# Patient Record
Sex: Female | Born: 1970 | Race: White | Hispanic: No
Health system: Midwestern US, Community
[De-identification: ages and names within clinical notes are randomized; demographics above are authoritative.]

## PROBLEM LIST (undated history)

## (undated) DIAGNOSIS — C519 Malignant neoplasm of vulva, unspecified: Secondary | ICD-10-CM

## (undated) DIAGNOSIS — J449 Chronic obstructive pulmonary disease, unspecified: Secondary | ICD-10-CM

## (undated) DIAGNOSIS — B009 Herpesviral infection, unspecified: Secondary | ICD-10-CM

## (undated) DIAGNOSIS — I10 Essential (primary) hypertension: Secondary | ICD-10-CM

## (undated) HISTORY — PX: TUBAL LIGATION: SHX77

## (undated) HISTORY — DX: Herpesviral infection, unspecified: B00.9

## (undated) HISTORY — PX: OTHER SURGICAL HISTORY: SHX169

---

## 1999-06-01 ENCOUNTER — Emergency Department (HOSPITAL_COMMUNITY): Admission: EM | Admit: 1999-06-01 | Discharge: 1999-06-01 | Payer: Self-pay | Admitting: Emergency Medicine

## 2000-02-28 ENCOUNTER — Emergency Department (HOSPITAL_COMMUNITY): Admission: EM | Admit: 2000-02-28 | Discharge: 2000-02-28 | Payer: Self-pay | Admitting: Emergency Medicine

## 2000-06-06 ENCOUNTER — Encounter: Admission: RE | Admit: 2000-06-06 | Discharge: 2000-06-06 | Payer: Self-pay | Admitting: *Deleted

## 2000-06-06 ENCOUNTER — Encounter: Payer: Self-pay | Admitting: *Deleted

## 2000-08-17 ENCOUNTER — Emergency Department (HOSPITAL_COMMUNITY): Admission: EM | Admit: 2000-08-17 | Discharge: 2000-08-17 | Payer: Self-pay | Admitting: Emergency Medicine

## 2000-11-04 ENCOUNTER — Emergency Department (HOSPITAL_COMMUNITY): Admission: EM | Admit: 2000-11-04 | Discharge: 2000-11-05 | Payer: Self-pay | Admitting: Emergency Medicine

## 2000-11-05 ENCOUNTER — Encounter: Payer: Self-pay | Admitting: Emergency Medicine

## 2000-11-06 ENCOUNTER — Emergency Department (HOSPITAL_COMMUNITY): Admission: EM | Admit: 2000-11-06 | Discharge: 2000-11-07 | Payer: Self-pay | Admitting: Emergency Medicine

## 2003-02-09 ENCOUNTER — Emergency Department (HOSPITAL_COMMUNITY): Admission: EM | Admit: 2003-02-09 | Discharge: 2003-02-09 | Payer: Self-pay | Admitting: Emergency Medicine

## 2013-01-30 ENCOUNTER — Encounter (HOSPITAL_COMMUNITY): Payer: Self-pay | Admitting: *Deleted

## 2013-01-30 ENCOUNTER — Emergency Department (HOSPITAL_COMMUNITY)
Admission: EM | Admit: 2013-01-30 | Discharge: 2013-01-30 | Disposition: A | Discharge status: Home / Self Care | Payer: Medicaid Other | Attending: Emergency Medicine | Admitting: Emergency Medicine

## 2013-01-30 ENCOUNTER — Emergency Department (HOSPITAL_COMMUNITY): Payer: Medicaid Other

## 2013-01-30 DIAGNOSIS — Z79899 Other long term (current) drug therapy: Secondary | ICD-10-CM | POA: Insufficient documentation

## 2013-01-30 DIAGNOSIS — I10 Essential (primary) hypertension: Secondary | ICD-10-CM | POA: Insufficient documentation

## 2013-01-30 DIAGNOSIS — L539 Erythematous condition, unspecified: Secondary | ICD-10-CM | POA: Insufficient documentation

## 2013-01-30 DIAGNOSIS — F17200 Nicotine dependence, unspecified, uncomplicated: Secondary | ICD-10-CM | POA: Insufficient documentation

## 2013-01-30 DIAGNOSIS — R0602 Shortness of breath: Secondary | ICD-10-CM | POA: Insufficient documentation

## 2013-01-30 DIAGNOSIS — N644 Mastodynia: Secondary | ICD-10-CM | POA: Insufficient documentation

## 2013-01-30 DIAGNOSIS — R0789 Other chest pain: Secondary | ICD-10-CM | POA: Insufficient documentation

## 2013-01-30 DIAGNOSIS — R059 Cough, unspecified: Secondary | ICD-10-CM | POA: Insufficient documentation

## 2013-01-30 DIAGNOSIS — R05 Cough: Secondary | ICD-10-CM | POA: Insufficient documentation

## 2013-01-30 HISTORY — DX: Essential (primary) hypertension: I10

## 2013-01-30 MED ORDER — OXYCODONE-ACETAMINOPHEN 5-325 MG PO TABS: ORAL_TABLET | ORAL | Status: DC

## 2013-01-30 MED ORDER — IBUPROFEN 800 MG PO TABS: 800.0000 mg | ORAL_TABLET | Freq: Three times a day (TID) | ORAL | Status: DC

## 2013-01-30 NOTE — ED Provider Notes (Signed)
History    CSN: [id number] Arrival date & time 01/30/13  1659  First MD Initiated Contact with Patient 01/30/13 1833     Chief Complaint  Patient presents with  . Chest Pain  . Breast Pain   (Consider location/radiation/quality/duration/timing/severity/associated sxs/prior Treatment) HPI Pt is a 42yo female presenting today with chest pain and bilateral breast pain that started 3 days ago.  Pt reports having been told she has a spot on her lung and needed f/u in 9mo but never did. Pt was also told she had hyperinflated lungs.  Today pt reports chest pain is tight, burning ache, has been using albuterol inhaler more often.  Pain feels like it is inside her lungs as well as causing both breast to be exquisitely tender to touch.  "I can't even touch them with a washcloth in the shower."  Nothing seems to make pain better.  Denies fever, chills, nipple discharge, breast lumps or masses, or color change.  No previous hx of same.    Past Medical History  Diagnosis Date  . Hypertension    Past Surgical History  Procedure Laterality Date  . Cesarean section    . Tubal ligation     No family history on file. History  Substance Use Topics  . Smoking status: Current Every Day Smoker  . Smokeless tobacco: Not on file  . Alcohol Use: No   OB History   Grav Para Term Preterm Abortions TAB SAB Ect Mult Living                 Review of Systems  Constitutional: Negative for fever and chills.  Respiratory: Positive for cough, chest tightness and shortness of breath.   Cardiovascular: Positive for chest pain ( breast pain).  All other systems reviewed and are negative.    Allergies  Review of patient's allergies indicates no known allergies.  Home Medications   Current Outpatient Rx  Name  Route  Sig  Dispense  Refill  . albuterol (PROVENTIL HFA;VENTOLIN HFA) 108 (90 BASE) MCG/ACT inhaler   Inhalation   Inhale 2 puffs into the lungs every 6 (six) hours as needed for wheezing.          Marland Kitchen ibuprofen (ADVIL,MOTRIN) 800 MG tablet   Oral   Take 1 tablet (800 mg total) by mouth 3 (three) times daily.   21 tablet   0   . oxyCODONE-acetaminophen (PERCOCET/ROXICET) 5-325 MG per tablet      Take 1-2 pills every 4-6 hours as needed for pain.   6 tablet   0    BP 122/50  Pulse 62  Temp(Src) 98.3 F (36.8 C) (Oral)  Resp 16  SpO2 100%  LMP 01/16/2013 Physical Exam  Nursing note and vitals reviewed. Constitutional: She appears well-developed and well-nourished.  Pt sitting comfortably in exam bed, NAD.     HENT:  Head: Normocephalic and atraumatic.  Eyes: Conjunctivae are normal. No scleral icterus.  Neck: Normal range of motion. Neck supple.  Cardiovascular: Normal rate, regular rhythm and normal heart sounds.   Pulmonary/Chest: Effort normal and breath sounds normal. No respiratory distress. She has no wheezes. She has no rales. She exhibits tenderness ( TTP over both nipples).  Enlarged, erythremic nipples without discharge. TTP.  No red streaking or breast masses   Abdominal: Soft. Bowel sounds are normal. She exhibits no distension and no mass. There is no tenderness. There is no rebound and no guarding.  Musculoskeletal: Normal range of motion.  Neurological: She is  alert.  Skin: Skin is warm and dry. There is erythema.    ED Course  Procedures (including critical care time) Labs Reviewed - No data to display Dg Chest 2 View  01/30/2013   *RADIOLOGY REPORT*  Clinical Data: Chest pain.  CHEST - 2 VIEW  Comparison: Two-view chest 12/11/2012 at [facility].  Findings: The heart size is normal.  The lungs are clear.  The visualized soft tissues and bony thorax are unremarkable.  IMPRESSION: Negative two-view chest.   Original Report Authenticated By: Marin Roberts, M.D.   1. Breast pain in female     MDM  Pt has bilateral breast and nipple irritation without obvious appearance of infection.  No changes seen on CXR. Discussed pt with Dr.  Juleen China.  Will tx pain Rx: percocet and ibuprofen. Will have pt f/u with PCP as scheduled for Friday 7/25 and have a mammogram as soon as possible.  Advised to watch for signs of infection.    Junius Finner, PA-C 02/01/13 [phone]

## 2013-01-30 NOTE — ED Notes (Signed)
PT is here with chest pain and states bilateral breast pain.  Pt states she had a spot on her lung and was supposed to follow up 6 months ago and never did.

## 2013-02-02 NOTE — ED Provider Notes (Signed)
Medical screening examination/treatment/procedure(s) were conducted as a shared visit with non-physician practitioner(s) and myself.  I personally evaluated the patient during the encounter.  42yF with atraumatic bl breast/CP. Pt seems with almost hyperesthesia. Breast exam otherwise fairly unremarkable. No concerning skin lesions. Etiology no apparent to me at this time, but my suspicion for an emergent etiology is low. Will tx pain. Return precautions discussed. Out pt FU including mammogram.   Raeford Razor, MD 02/02/13 [phone]

## 2013-06-20 ENCOUNTER — Emergency Department (HOSPITAL_COMMUNITY): Payer: Medicaid Other

## 2013-06-20 ENCOUNTER — Encounter (HOSPITAL_COMMUNITY): Payer: Self-pay | Admitting: Emergency Medicine

## 2013-06-20 ENCOUNTER — Emergency Department (HOSPITAL_COMMUNITY)
Admission: EM | Admit: 2013-06-20 | Discharge: 2013-06-20 | Disposition: A | Discharge status: Home / Self Care | Payer: Medicaid Other | Attending: Emergency Medicine | Admitting: Emergency Medicine

## 2013-06-20 DIAGNOSIS — F17200 Nicotine dependence, unspecified, uncomplicated: Secondary | ICD-10-CM | POA: Insufficient documentation

## 2013-06-20 DIAGNOSIS — J4489 Other specified chronic obstructive pulmonary disease: Secondary | ICD-10-CM | POA: Insufficient documentation

## 2013-06-20 DIAGNOSIS — N939 Abnormal uterine and vaginal bleeding, unspecified: Secondary | ICD-10-CM

## 2013-06-20 DIAGNOSIS — Z3202 Encounter for pregnancy test, result negative: Secondary | ICD-10-CM | POA: Insufficient documentation

## 2013-06-20 DIAGNOSIS — R109 Unspecified abdominal pain: Secondary | ICD-10-CM

## 2013-06-20 DIAGNOSIS — J449 Chronic obstructive pulmonary disease, unspecified: Secondary | ICD-10-CM | POA: Insufficient documentation

## 2013-06-20 DIAGNOSIS — N898 Other specified noninflammatory disorders of vagina: Secondary | ICD-10-CM | POA: Insufficient documentation

## 2013-06-20 DIAGNOSIS — Z8544 Personal history of malignant neoplasm of other female genital organs: Secondary | ICD-10-CM | POA: Insufficient documentation

## 2013-06-20 DIAGNOSIS — Z79899 Other long term (current) drug therapy: Secondary | ICD-10-CM | POA: Insufficient documentation

## 2013-06-20 DIAGNOSIS — IMO0002 Reserved for concepts with insufficient information to code with codable children: Secondary | ICD-10-CM | POA: Insufficient documentation

## 2013-06-20 HISTORY — DX: Chronic obstructive pulmonary disease, unspecified: J44.9

## 2013-06-20 HISTORY — DX: Malignant neoplasm of vulva, unspecified: C51.9

## 2013-06-20 LAB — URINALYSIS, ROUTINE W REFLEX MICROSCOPIC
Hgb urine dipstick: NEGATIVE
Ketones, ur: NEGATIVE mg/dL
Leukocytes, UA: NEGATIVE
Protein, ur: NEGATIVE mg/dL
Urobilinogen, UA: 0.2 mg/dL (ref 0.0–1.0)

## 2013-06-20 LAB — URINE MICROSCOPIC-ADD ON

## 2013-06-20 LAB — WET PREP, GENITAL
Clue Cells Wet Prep HPF POC: NONE SEEN
Trich, Wet Prep: NONE SEEN
Yeast Wet Prep HPF POC: NONE SEEN

## 2013-06-20 LAB — PREGNANCY, URINE: Preg Test, Ur: NEGATIVE

## 2013-06-20 MED ORDER — HYDROCODONE-ACETAMINOPHEN 5-325 MG PO TABS: 2.0000 | ORAL_TABLET | Freq: Once | ORAL | Status: DC

## 2013-06-20 MED ORDER — HYDROCODONE-ACETAMINOPHEN 5-325 MG PO TABS
1.0000 | ORAL_TABLET | Freq: Once | ORAL | Status: AC
  Administered 2013-06-20: 1 via ORAL
  Filled 2013-06-20: qty 1

## 2013-06-20 NOTE — ED Notes (Signed)
Menstrual cycle started accompany by abnormally sever pain in lower abdomen. Bleeding is usual as well with most of the blood not on the pad but blood come out when she urinate. Pt is tender and sore on lower central abdomen. No other complaint otherwise.

## 2013-06-20 NOTE — ED Notes (Signed)
Pt states she started her period yesterday and all day she had severe pain  Pt states tonight she states her stomach is very bloated and states her flow of her period is different than normal

## 2013-06-20 NOTE — ED Notes (Signed)
Walked in to discharge this patient, pt already left. Therefore was unable to obtain a discharge vital sign, and was unable to give pt discharge instruction

## 2013-06-20 NOTE — ED Provider Notes (Addendum)
CSN: [id number]     Arrival date & time 06/20/13  1948 History   First MD Initiated Contact with Patient 06/20/13 2049     Chief Complaint  Patient presents with  . Abdominal Pain   (Consider location/radiation/quality/duration/timing/severity/associated sxs/prior Treatment) HPI Comments: 42 year old female presents with abdominal pain that started yesterday. She states it started like her normal. Except that her bleeding seems more abnormal. She states that which is walking around she doesn't have bleeding but when she goes to the bathroom a lot of blood comes out of her vagina. She states that has not rectal bleeding. She feels like the pain is worse than her normal period at this time. She last had her period on November 19 and feels that this bleeding is early for her. She's also been having some night sweats and thinks she is going through menopause. She's not any nausea, vomiting, diarrhea, or constipation. She felt like her abdomen started to swell and feels bloated. She's worried that something ruptured. She's tried Midol and ibuprofen without success.   Past Medical History  Diagnosis Date  . COPD (chronic obstructive pulmonary disease)   . Vulva cancer    Past Surgical History  Procedure Laterality Date  . Cesarean section    . Tubal ligation    . Laser surgery for vulva cancer     Family History  Problem Relation Age of Onset  . Hypertension Mother   . Hypertension Father   . Diabetes Father   . COPD Father   . Gout Father    History  Substance Use Topics  . Smoking status: Current Every Day Smoker  . Smokeless tobacco: Not on file  . Alcohol Use: Yes     Comment: rare   OB History   Grav Para Term Preterm Abortions TAB SAB Ect Mult Living                 Review of Systems  Gastrointestinal: Positive for abdominal pain. Negative for nausea, vomiting, diarrhea, constipation, blood in stool and anal bleeding.  Genitourinary: Positive for vaginal bleeding and  menstrual problem. Negative for dysuria and vaginal discharge.  Neurological: Negative for dizziness and light-headedness.    Allergies  Review of patient's allergies indicates no known allergies.  Home Medications   Current Outpatient Rx  Name  Route  Sig  Dispense  Refill  . albuterol (PROVENTIL HFA;VENTOLIN HFA) 108 (90 BASE) MCG/ACT inhaler   Inhalation   Inhale 2 puffs into the lungs every 6 (six) hours as needed for wheezing or shortness of breath.         . Aspirin-Salicylamide-Caffeine (BC HEADACHE POWDER PO)   Oral   Take 1 packet by mouth daily as needed (headache.).         Marland Kitchen budesonide-formoterol (SYMBICORT) 160-4.5 MCG/ACT inhaler   Inhalation   Inhale 2 puffs into the lungs 2 (two) times daily.         . fluticasone (FLONASE) 50 MCG/ACT nasal spray   Each Nare   Place 1 spray into both nostrils 2 (two) times daily.         Marland Kitchen ibuprofen (ADVIL,MOTRIN) 200 MG tablet   Oral   Take 200 mg by mouth every 6 (six) hours as needed.          BP 113/65  Pulse 78  Temp(Src) 98 F (36.7 C) (Oral)  Resp 18  SpO2 95% Physical Exam  Nursing note and vitals reviewed. Constitutional: She is oriented to person, place, and  time. She appears well-developed and well-nourished.  HENT:  Head: Normocephalic and atraumatic.  Right Ear: External ear normal.  Left Ear: External ear normal.  Nose: Nose normal.  Eyes: Right eye exhibits no discharge. Left eye exhibits no discharge.  Cardiovascular: Normal rate, regular rhythm and normal heart sounds.   Pulmonary/Chest: Effort normal and breath sounds normal.  Abdominal: Soft. She exhibits no distension and no mass. There is tenderness (mild, worst in pelvis). There is no rebound and no guarding.  Genitourinary: Uterus is tender (minimal). Cervix exhibits no motion tenderness. There is bleeding around the vagina. No signs of injury around the vagina.  Neurological: She is alert and oriented to person, place, and time.   Skin: Skin is warm and dry.    ED Course  Procedures (including critical care time) Labs Review Labs Reviewed  WET PREP, GENITAL - Abnormal; Notable for the following:    WBC, Wet Prep HPF POC FEW (*)    All other components within normal limits  URINALYSIS, ROUTINE W REFLEX MICROSCOPIC - Abnormal; Notable for the following:    APPearance TURBID (*)    All other components within normal limits  GC/CHLAMYDIA PROBE AMP  PREGNANCY, URINE  URINE MICROSCOPIC-ADD ON   Imaging Review US Transvaginal Non-ob  06/20/2013   CLINICAL DATA:  Pain heavy bleeding. Dysfunctional uterine bleeding. LMP 06/19/2013.  EXAM: TRANSABDOMINAL AND TRANSVAGINAL ULTRASOUND OF PELVIS  TECHNIQUE: Both transabdominal and transvaginal ultrasound examinations of the pelvis were performed. Transabdominal technique was performed for global imaging of the pelvis including uterus, ovaries, adnexal regions, and pelvic cul-de-sac. It was necessary to proceed with endovaginal exam following the transabdominal exam to visualize the uterus, endometrium, ovaries, and adnexal regions.  COMPARISON:  None  FINDINGS: Uterus  Measurements: 9.6 x 3.4 x 5.0 cm. No fibroids or other mass visualized.  Endometrium  Thickness: 2.2 mm.  No focal abnormality visualized.  Right ovary  Measurements: 3.4 x 1.9 x 3.2 cm. Largest follicle is 2.6 cm.  Left ovary  Measurements: 3.2 x 2.1 x 3.0 cm. Largest follicle is 2.8 cm.  Other findings  No free fluid.  IMPRESSION: 1. Normal appearance of the uterus/endometrium. If bleeding remains unresponsive to hormonal or medical therapy, sonohysterogram should be considered for focal lesion work-up. (Ref: Radiological Reasoning: Algorithmic Workup of Abnormal Vaginal Bleeding with Endovaginal Sonography and Sonohysterography. AJR 2008; 956:O13-08) 2. Normal appearance of the ovaries.   Electronically Signed   By: Rosalie Gums M.D.   On: 06/20/2013 22:39   US Pelvis Complete  06/20/2013   CLINICAL DATA:  Pain  heavy bleeding. Dysfunctional uterine bleeding. LMP 06/19/2013.  EXAM: TRANSABDOMINAL AND TRANSVAGINAL ULTRASOUND OF PELVIS  TECHNIQUE: Both transabdominal and transvaginal ultrasound examinations of the pelvis were performed. Transabdominal technique was performed for global imaging of the pelvis including uterus, ovaries, adnexal regions, and pelvic cul-de-sac. It was necessary to proceed with endovaginal exam following the transabdominal exam to visualize the uterus, endometrium, ovaries, and adnexal regions.  COMPARISON:  None  FINDINGS: Uterus  Measurements: 9.6 x 3.4 x 5.0 cm. No fibroids or other mass visualized.  Endometrium  Thickness: 2.2 mm.  No focal abnormality visualized.  Right ovary  Measurements: 3.4 x 1.9 x 3.2 cm. Largest follicle is 2.6 cm.  Left ovary  Measurements: 3.2 x 2.1 x 3.0 cm. Largest follicle is 2.8 cm.  Other findings  No free fluid.  IMPRESSION: 1. Normal appearance of the uterus/endometrium. If bleeding remains unresponsive to hormonal or medical therapy, sonohysterogram should be considered  for focal lesion work-up. (Ref: Radiological Reasoning: Algorithmic Workup of Abnormal Vaginal Bleeding with Endovaginal Sonography and Sonohysterography. AJR 2008; 782:N56-21) 2. Normal appearance of the ovaries.   Electronically Signed   By: Rosalie Gums M.D.   On: 06/20/2013 22:39    EKG Interpretation   None       MDM   1. Vaginal bleeding   2. Abdominal pain    Abd exam is benign, is soft, no distention and mild pelvic tenderness. No focal tenderness in RLQ or LLQ. Pelvic shows some mild blood but no CMT or masses. Pelvic u/s shows no uterine trauma or fibroids. No signs of anemia on history or exam. Will treat asymptomatically and recommend close f/u with her GYN.     Audree Camel, MD 06/21/13 3086  Audree Camel, MD 06/21/13 [phone]

## 2013-06-21 ENCOUNTER — Encounter (HOSPITAL_COMMUNITY): Payer: Self-pay | Admitting: Emergency Medicine

## 2013-06-21 LAB — POCT PREGNANCY, URINE: Preg Test, Ur: NEGATIVE

## 2013-06-21 LAB — GC/CHLAMYDIA PROBE AMP: CT Probe RNA: NEGATIVE

## 2014-07-03 ENCOUNTER — Inpatient Hospital Stay
Admit: 2014-07-03 | Discharge: 2014-07-03 | Disposition: A | Discharge status: Home / Self Care | Payer: Self-pay | Attending: Emergency Medicine

## 2014-07-03 ENCOUNTER — Emergency Department: Admit: 2014-07-03 | Payer: Self-pay | Primary: General Acute Care Hospital

## 2014-07-03 DIAGNOSIS — R51 Headache: Secondary | ICD-10-CM

## 2014-07-03 LAB — METABOLIC PANEL, COMPREHENSIVE
A-G Ratio: 1 — ABNORMAL LOW (ref 1.2–3.5)
ALT (SGPT): 11 U/L — ABNORMAL LOW (ref 12–65)
AST (SGOT): 17 U/L (ref 15–37)
Albumin: 3.9 g/dL (ref 3.5–5.0)
Alk. phosphatase: 80 U/L (ref 50–136)
Anion gap: 9 mmol/L (ref 7–16)
BUN: 19 MG/DL (ref 6–23)
Bilirubin, total: 0.1 MG/DL — ABNORMAL LOW (ref 0.2–1.1)
CO2: 28 mmol/L (ref 21–32)
Calcium: 8.5 MG/DL (ref 8.3–10.4)
Chloride: 104 mmol/L (ref 98–107)
Creatinine: 1.01 MG/DL — ABNORMAL HIGH (ref 0.6–1.0)
GFR est AA: >60 mL/min/{1.73_m2} (ref >60)
GFR est non-AA: >60 mL/min/{1.73_m2} (ref >60)
Globulin: 3.9 g/dL — ABNORMAL HIGH (ref 2.3–3.5)
Glucose: 89 mg/dL (ref 65–100)
Potassium: 3.7 mmol/L (ref 3.5–5.1)
Protein, total: 7.8 g/dL (ref 6.3–8.2)
Sodium: 141 mmol/L (ref 136–145)

## 2014-07-03 LAB — CBC W/O DIFF
HCT: 39.1 % (ref 35.8–46.3)
HGB: 11.7 g/dL (ref 11.7–15.4)
MCH: 22 PG — ABNORMAL LOW (ref 26.1–32.9)
MCHC: 29.9 g/dL — ABNORMAL LOW (ref 31.4–35.0)
MCV: 73.6 FL — ABNORMAL LOW (ref 79.6–97.8)
MPV: 10.7 FL — ABNORMAL LOW (ref 10.8–14.1)
PLATELET: 292 10*3/uL (ref 150–450)
RBC: 5.31 M/uL — ABNORMAL HIGH (ref 4.05–5.25)
RDW: 20.2 % — ABNORMAL HIGH (ref 11.9–14.6)
WBC: 6.4 10*3/uL (ref 4.3–11.1)

## 2014-07-03 LAB — HCG URINE, QL. - POC: Pregnancy test,urine (POC): NEGATIVE

## 2014-07-03 MED ORDER — GUAIFENESIN 600 MG TABLET,EXTENDED RELEASE BIPHASIC 12 HR
600 mg | ORAL_TABLET | Freq: Two times a day (BID) | ORAL | Status: DC
Start: 2014-07-03 — End: 2014-07-19

## 2014-07-03 MED ORDER — BENZONATATE 200 MG CAP
200 mg | ORAL_CAPSULE | Freq: Three times a day (TID) | ORAL | Status: AC | PRN
Start: 2014-07-03 — End: 2014-07-10

## 2014-07-03 MED ORDER — PREDNISONE 50 MG TAB
50 mg | ORAL_TABLET | Freq: Every day | ORAL | Status: AC
Start: 2014-07-03 — End: 2014-07-06

## 2014-07-03 MED ORDER — HYDROCODONE-ACETAMINOPHEN 5 MG-325 MG TAB
5-325 mg | Freq: Once | ORAL | Status: AC
Start: 2014-07-03 — End: 2014-07-03
  Administered 2014-07-03: 16:00:00 via ORAL

## 2014-07-03 MED ORDER — IPRATROPIUM-ALBUTEROL 2.5 MG-0.5 MG/3 ML NEB SOLUTION
2.5 mg-0.5 mg/3 ml | RESPIRATORY_TRACT | Status: AC
Start: 2014-07-03 — End: 2014-07-03
  Administered 2014-07-03: 15:00:00 via RESPIRATORY_TRACT

## 2014-07-03 MED ORDER — TRAMADOL 50 MG TAB
50 mg | ORAL_TABLET | Freq: Four times a day (QID) | ORAL | Status: DC | PRN
Start: 2014-07-03 — End: 2014-07-19

## 2014-07-03 MED ORDER — TRIMETHOPRIM-SULFAMETHOXAZOLE 160 MG-800 MG TAB
160-800 mg | ORAL_TABLET | Freq: Two times a day (BID) | ORAL | Status: AC
Start: 2014-07-03 — End: 2014-07-10

## 2014-07-03 MED ORDER — PREDNISONE 20 MG TAB
20 mg | ORAL_TABLET | Freq: Every day | ORAL | Status: AC
Start: 2014-07-03 — End: 2014-07-10

## 2014-07-03 MED ORDER — PREDNISONE 20 MG TAB
20 mg | ORAL | Status: AC
Start: 2014-07-03 — End: 2014-07-03
  Administered 2014-07-03: 15:00:00 via ORAL

## 2014-07-03 MED FILL — HYDROCODONE-ACETAMINOPHEN 5 MG-325 MG TAB: 5-325 mg | ORAL | Qty: 1

## 2014-07-03 MED FILL — IPRATROPIUM-ALBUTEROL 2.5 MG-0.5 MG/3 ML NEB SOLUTION: 2.5 mg-0.5 mg/3 ml | RESPIRATORY_TRACT | Qty: 3

## 2014-07-03 MED FILL — PREDNISONE 20 MG TAB: 20 mg | ORAL | Qty: 3

## 2014-07-03 NOTE — ED Notes (Signed)
Pt's main complaint is her headache. She wished to get it managed so she can get some sleep.

## 2014-07-03 NOTE — ED Notes (Signed)
Pt c/o headache for 2 days, unable to sleep. Pt has tried Ibuprofen, Benadryl and melatonin to try and get relief and sleep.

## 2014-07-03 NOTE — ED Provider Notes (Addendum)
HPI Comments: 43 y/o f to ed with complaint of sharp chest pain worse with inspiration and dry cough over the last 2 mos.  Smokes 2ppd.  Uses symbicort twice daily and pro air prn - used that 3 times yest.  Now with headache last 2 days on left side, no visual disturbances or neuro deficits.  Admits feels hot at home but no recorded fever.      Patient is a 43 y.o. female presenting with headaches. The history is provided by the patient. No language interpreter was used.   Headache   This is a recurrent problem. The current episode started more than 1 week ago. The problem occurs constantly. The problem has not changed since onset.The headache is aggravated by coughing. The pain is located in the left unilateral region. Associated symptoms include a fever (feeling hot at home), malaise/fatigue, orthopnea (wheezes at night) and shortness of breath. Pertinent negatives include no anorexia, no chest pressure, no near-syncope, no palpitations, no syncope, no weakness, no tingling, no dizziness, no visual change, no nausea and no vomiting. She has tried NSAIDs for the symptoms.        Past Medical History:   Diagnosis Date   ??? Chronic obstructive pulmonary disease (HCC)    ??? Endocrine disease      hypoglycemia   ??? Psychiatric disorder      Anxiety, depression       Past Surgical History:   Procedure Laterality Date   ??? Hx gyn       tubal ligation, C Section         History reviewed. No pertinent family history.    History     Social History   ??? Marital Status: DIVORCED     Spouse Name: N/A     Number of Children: N/A   ??? Years of Education: N/A     Occupational History   ??? Not on file.     Social History Main Topics   ??? Smoking status: Current Every Day Smoker -- 1.50 packs/day for 29 years   ??? Smokeless tobacco: Not on file   ??? Alcohol Use: Yes      Comment: social   ??? Drug Use: No   ??? Sexual Activity: Not on file     Other Topics Concern   ??? Not on file     Social History Narrative   ??? No narrative on file                 ALLERGIES: Review of patient's allergies indicates no known allergies.      Review of Systems   Constitutional: Positive for fever (feeling hot at home), malaise/fatigue and fatigue. Negative for chills.   HENT: Negative for ear pain, mouth sores, sinus pressure, sore throat and trouble swallowing.    Eyes: Negative for photophobia, pain and visual disturbance.   Respiratory: Positive for cough (dry), shortness of breath and wheezing (at night).    Cardiovascular: Positive for chest pain (sharp pain across sternum worse with inspiration and cough) and orthopnea (wheezes at night). Negative for palpitations, syncope and near-syncope.   Gastrointestinal: Negative for nausea, vomiting, abdominal pain, diarrhea and anorexia.   Endocrine: Negative for cold intolerance and heat intolerance.   Genitourinary: Negative for dysuria, urgency, difficulty urinating and menstrual problem (lmp 3 weeks ago regular and on time).   Musculoskeletal: Negative for back pain and neck pain.   Skin: Negative for rash and wound.   Neurological: Positive for headaches. Negative  for dizziness, tingling, tremors, seizures, syncope, facial asymmetry, speech difficulty, weakness, light-headedness and numbness.   Psychiatric/Behavioral: Negative for confusion and decreased concentration.       Filed Vitals:    07/03/14 0919 07/03/14 0930   BP: 148/65    Pulse: 72    Temp: 97.7 ??F (36.5 ??C)    Resp: 18    Height: 5\' 4"  (1.626 m)    Weight: 58.968 kg (130 lb)    SpO2: 100% 100%            Physical Exam   Constitutional: She is oriented to person, place, and time. She appears well-developed and well-nourished. No distress.   HENT:   Head: Normocephalic and atraumatic.   Right Ear: External ear normal.   Left Ear: External ear normal.   Nose: Nose normal.   Eyes: Conjunctivae and EOM are normal. Pupils are equal, round, and reactive to light.   Neck: Normal range of motion. Neck supple.    Cardiovascular: Normal rate, regular rhythm and normal heart sounds.    Pulmonary/Chest: Effort normal. No respiratory distress. She has no wheezes.   No wheezes auscultated, but notably decreased breath sounds on exhalation   Abdominal: Soft. Bowel sounds are normal. She exhibits no distension. There is no tenderness.   Musculoskeletal: Normal range of motion. She exhibits no edema or tenderness.   Neurological: She is alert and oriented to person, place, and time. No cranial nerve deficit. Coordination normal.   No facial asymmetry, no visual disturbances, eoms intact.  Ambulates with no gait difficulty.     Skin: Skin is warm and dry. No rash noted.   Psychiatric: She has a normal mood and affect. Her behavior is normal. Judgment and thought content normal.   Nursing note and vitals reviewed.       MDM  Number of Diagnoses or Management Options  Diagnosis management comments: 43 y/o f with 2ppd smoking hsx presents with headache for two days, cough and wheezing for two months. Will rule out pna, eval baseline blood work and urine.  Provide inhaled bd and steroid  10:35 AM  Feels like she can breath better now after hhn.  Reviewed diagnostics with her and importance of smoking cessation. She will try.  Will dc home with rx for bronchitis.  F/u with family md       Amount and/or Complexity of Data Reviewed  Clinical lab tests: ordered and reviewed  Tests in the radiology section of CPT??: ordered and reviewed    Risk of Complications, Morbidity, and/or Mortality  Presenting problems: minimal  Diagnostic procedures: low  Management options: low    Patient Progress  Patient progress: improved      Procedures

## 2014-07-19 ENCOUNTER — Emergency Department: Admit: 2014-07-19 | Payer: Self-pay | Primary: General Acute Care Hospital

## 2014-07-19 ENCOUNTER — Inpatient Hospital Stay
Admit: 2014-07-19 | Discharge: 2014-07-19 | Disposition: A | Discharge status: Home / Self Care | Payer: Self-pay | Attending: Emergency Medicine

## 2014-07-19 DIAGNOSIS — J440 Chronic obstructive pulmonary disease with acute lower respiratory infection: Secondary | ICD-10-CM

## 2014-07-19 MED ORDER — BUDESONIDE-FORMOTEROL HFA 160 MCG-4.5 MCG/ACTUATION AEROSOL INHALER
Freq: Two times a day (BID) | RESPIRATORY_TRACT | Status: AC
Start: 2014-07-19 — End: ?

## 2014-07-19 MED ORDER — IPRATROPIUM-ALBUTEROL 2.5 MG-0.5 MG/3 ML NEB SOLUTION
2.5 mg-0.5 mg/3 ml | RESPIRATORY_TRACT | Status: AC
Start: 2014-07-19 — End: 2014-07-19
  Administered 2014-07-19: 18:00:00 via RESPIRATORY_TRACT

## 2014-07-19 MED ORDER — PREDNISONE 20 MG TAB
20 mg | ORAL | Status: AC
Start: 2014-07-19 — End: 2014-07-19
  Administered 2014-07-19: 18:00:00 via ORAL

## 2014-07-19 MED ORDER — LEVOFLOXACIN 500 MG TAB
500 mg | ORAL_TABLET | Freq: Every day | ORAL | Status: AC
Start: 2014-07-19 — End: 2014-07-26

## 2014-07-19 MED ORDER — PREDNISONE 50 MG TAB
50 mg | ORAL_TABLET | Freq: Every day | ORAL | Status: AC
Start: 2014-07-19 — End: 2014-07-22

## 2014-07-19 MED ORDER — PREDNISONE 20 MG TAB
20 mg | ORAL_TABLET | Freq: Every day | ORAL | Status: AC
Start: 2014-07-19 — End: 2014-07-26

## 2014-07-19 MED ORDER — ALBUTEROL SULFATE HFA 90 MCG/ACTUATION AEROSOL INHALER
90 mcg/actuation | Freq: Four times a day (QID) | RESPIRATORY_TRACT | Status: AC | PRN
Start: 2014-07-19 — End: ?

## 2014-07-19 MED ORDER — GUAIFENESIN 600 MG TABLET,EXTENDED RELEASE BIPHASIC 12 HR
600 mg | ORAL_TABLET | Freq: Two times a day (BID) | ORAL | Status: AC
Start: 2014-07-19 — End: ?

## 2014-07-19 MED ORDER — FLUTICASONE 50 MCG/ACTUATION NASAL SPRAY, SUSP
50 mcg/actuation | Freq: Every day | NASAL | Status: AC
Start: 2014-07-19 — End: ?

## 2014-07-19 MED FILL — PREDNISONE 20 MG TAB: 20 mg | ORAL | Qty: 3

## 2014-07-19 MED FILL — IPRATROPIUM-ALBUTEROL 2.5 MG-0.5 MG/3 ML NEB SOLUTION: 2.5 mg-0.5 mg/3 ml | RESPIRATORY_TRACT | Qty: 3

## 2014-07-19 NOTE — Progress Notes (Signed)
Post treatment peak flow 350 l/min.

## 2014-07-19 NOTE — Progress Notes (Signed)
07/19/14 1308   Oxygen Therapy   O2 Sat (%) 97 %   O2 Device Room air   Pre-Treatment   Breathing Pattern Regular   Cough Non-productive   Breath Sounds Bilateral Scattered wheezing   Pulse 64   SpO2 97 %   Respirations 16   Peak Flow 270   Post-Treatment   Breathing Pattern Regular   Cough Non-productive   Breath Sounds Bilateral Expiratory wheezing   Pulse 66   SpO2 98 %   Respirations 18   Treatment Tolerance Well   Procedures   $$ Initial Procedures Aerosol   Delivery Source Mask   Aerosolized Medications DuoNeb   Performed Peak flow with patient, who made good effort and achieved (best of three attempts) 270l/min.  Explained purpose of peak flow to patient who has no questions at this time.

## 2014-07-19 NOTE — ED Notes (Signed)
The patient was given their discharge instructions and  was given prescriptions.   The  patient verbalized understanding and had no additional questions. The patient was alert and was discharged via Ambulatory, without additional complaints at time of discharge.  No apparent distress noted

## 2014-07-19 NOTE — ED Notes (Signed)
Continued cough x several weeks, not getting better despite treatment

## 2014-07-19 NOTE — ED Provider Notes (Addendum)
HPI Comments: 44 y/o f returns to Ed after tsx here by me 12/24 for bronchitis.  She has continued to smoke and is not using symbicort because the proair makes her feel better faster.  Cough prod of brown sputum as well as fever and congestion have continued.  No ear pain but sinus pain sore throat as well as sinus drainage are present.  Wheezing at home with diminished breath sounds here.  No vomiting or diarrhea, no chest pain or palpitations.  No lower ext edema.      Patient is a 44 y.o. female presenting with cough. The history is provided by the patient. No language interpreter was used.   Cough  This is a new problem. The current episode started more than 1 week ago. The problem occurs constantly. The problem has not changed since onset.The cough is productive of sputum and productive of brown sputum. There has been no fever. Associated symptoms include chills, rhinorrhea, sore throat, shortness of breath and wheezing. Pertinent negatives include no chest pain, no sweats, no weight loss, no eye redness, no ear congestion, no ear pain, no headaches, no myalgias, no nausea, no vomiting and no confusion. She has tried antibiotics, steroids and inhalers for the symptoms. She is a smoker. Her past medical history is significant for bronchitis.        Past Medical History:   Diagnosis Date   ??? Chronic obstructive pulmonary disease (HCC)    ??? Endocrine disease      hypoglycemia   ??? Psychiatric disorder      Anxiety, depression       Past Surgical History:   Procedure Laterality Date   ??? Hx gyn       tubal ligation, C Section         History reviewed. No pertinent family history.    History     Social History   ??? Marital Status: DIVORCED     Spouse Name: N/A     Number of Children: N/A   ??? Years of Education: N/A     Occupational History   ??? Not on file.     Social History Main Topics   ??? Smoking status: Current Every Day Smoker -- 1.50 packs/day for 29 years   ??? Smokeless tobacco: Not on file    ??? Alcohol Use: Yes      Comment: social   ??? Drug Use: No   ??? Sexual Activity: Not on file     Other Topics Concern   ??? Not on file     Social History Narrative                ALLERGIES: Review of patient's allergies indicates no known allergies.      Review of Systems   Constitutional: Positive for fever, chills and fatigue. Negative for weight loss.   HENT: Positive for postnasal drip, rhinorrhea, sinus pressure and sore throat. Negative for ear pain and trouble swallowing.    Eyes: Negative for pain and redness.   Respiratory: Positive for cough, shortness of breath and wheezing.    Cardiovascular: Negative for chest pain, palpitations and leg swelling.   Gastrointestinal: Negative for nausea, vomiting, abdominal pain and diarrhea.   Endocrine: Negative for cold intolerance and heat intolerance.   Genitourinary: Negative for dysuria and difficulty urinating.   Musculoskeletal: Negative for myalgias and back pain.   Skin: Negative for rash and wound.   Neurological: Negative for facial asymmetry, light-headedness and headaches.   Psychiatric/Behavioral: Negative  for confusion and decreased concentration.       Filed Vitals:    07/19/14 1200 07/19/14 1227   BP: 124/76    Pulse: 82    Temp: 98.2 ??F (36.8 ??C)    Resp: 22    Height:  (1.626 m)    Weight: 58.968 kg (130 lb)    SpO2: 97% 97%            Physical Exam   Constitutional: She is oriented to person, place, and time. She appears well-developed and well-nourished. No distress.   HENT:   Head: Normocephalic and atraumatic.   Right Ear: Hearing, tympanic membrane, external ear and ear canal normal.   Left Ear: Hearing, tympanic membrane, external ear and ear canal normal.   Nose: Rhinorrhea present. Right sinus exhibits maxillary sinus tenderness and frontal sinus tenderness. Left sinus exhibits maxillary sinus tenderness and frontal sinus tenderness.   Mouth/Throat: Uvula is midline. No trismus in the jaw. Posterior  oropharyngeal edema and posterior oropharyngeal erythema present. No oropharyngeal exudate.   Eyes: Conjunctivae and EOM are normal. Pupils are equal, round, and reactive to light.   Neck: Normal range of motion. Neck supple.   Cardiovascular: Normal rate, regular rhythm and normal heart sounds.    Pulmonary/Chest: Effort normal. No respiratory distress. She has no wheezes.   Breath sounds are diminished bilaterally.  No wheezes audible at present time   Abdominal: Soft. Bowel sounds are normal. She exhibits no distension. There is no tenderness.   Musculoskeletal: Normal range of motion. She exhibits no edema or tenderness.   Neurological: She is alert and oriented to person, place, and time. No cranial nerve deficit. Coordination normal.   Skin: Skin is warm and dry. No rash noted.   Psychiatric: She has a normal mood and affect. Her behavior is normal. Judgment and thought content normal.   Nursing note and vitals reviewed.       MDM  Number of Diagnoses or Management Options  Acute bronchitis, unspecified organism:   Diagnosis management comments: 44 y/o f with long smoking hsx with likely advanced copd presents with recurrence of bronchitis.  Seen here approx  3 weeks ago with same.  Not using long acting  Inhaled bd but likes the proair.  cxr neg.  Will provide inhaled bd and steroid here, provide rx for same as well as new absx and encouage follow up for long term pulmonary needs  1:27 PM  hhn complete and pt moving air better.  Reviewed again importance of smoking cessation and follow up.  She agrees to take meds as prescribed to to follow with new family md asap       Amount and/or Complexity of Data Reviewed  Tests in the radiology section of CPT??: ordered and reviewed    Risk of Complications, Morbidity, and/or Mortality  Presenting problems: low  Diagnostic procedures: low  Management options: low    Patient Progress  Patient progress: improved      Procedures

## 2015-02-15 IMAGING — US US PELVIS COMPLETE
1 series · 13 of 25 positions shown · non-contrast
Comparison: None

CLINICAL DATA: Pain heavy bleeding. Dysfunctional uterine bleeding.
LMP 06/19/2013.

EXAM:
TRANSABDOMINAL AND TRANSVAGINAL ULTRASOUND OF PELVIS
TECHNIQUE: Both transabdominal and transvaginal ultrasound examinations of the
pelvis were performed. Transabdominal technique was performed for
global imaging of the pelvis including uterus, ovaries, adnexal
regions, and pelvic cul-de-sac. It was necessary to proceed with
endovaginal exam following the transabdominal exam to visualize the
uterus, endometrium, ovaries, and adnexal regions..

[Series 1: us pelvis complete · 0.24mm/px · 52 acquisitions, 13 frames shown]
[im 1/52]
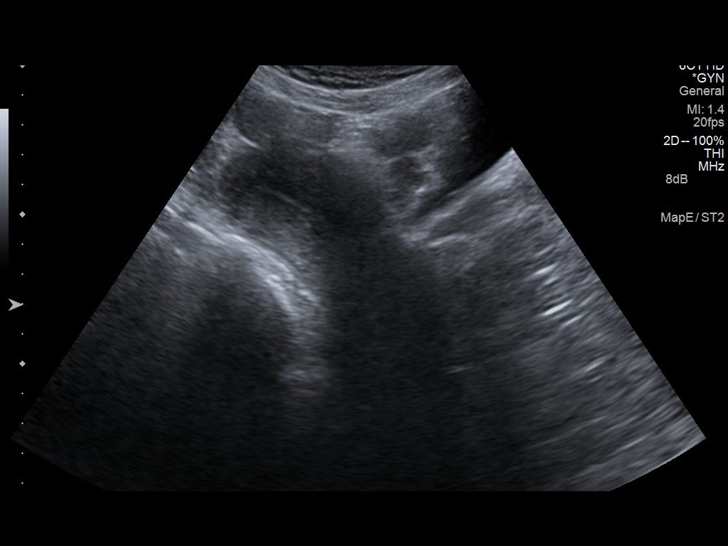
[im 5/52]
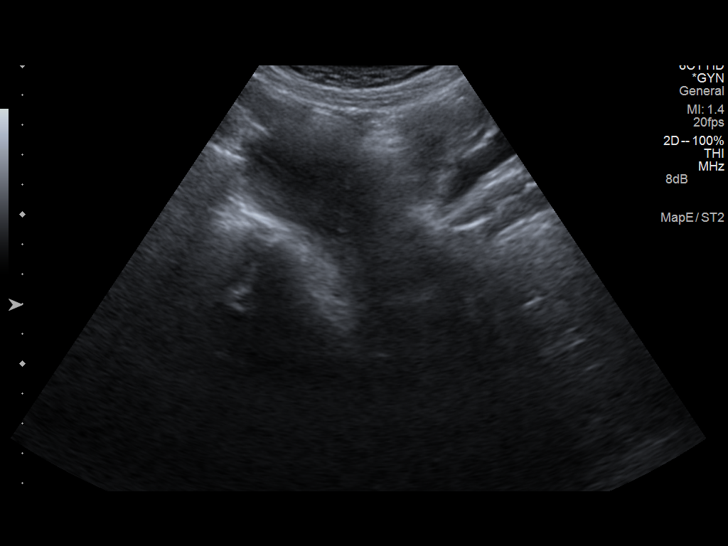
[im 9/52]
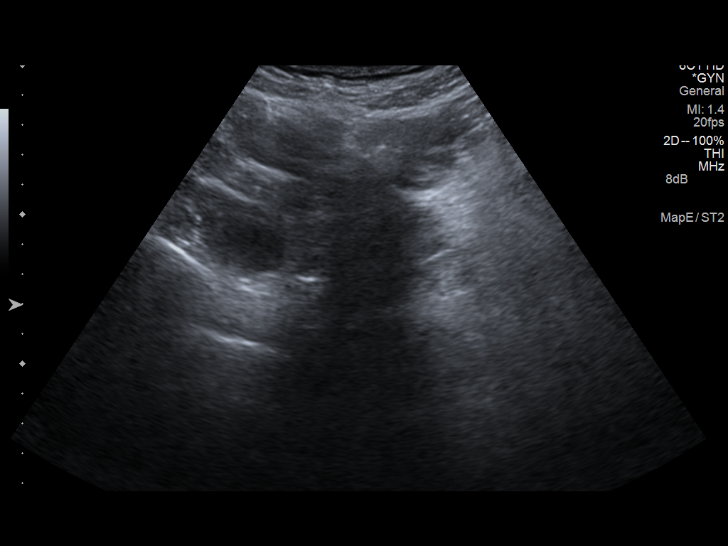
[im 13/52]
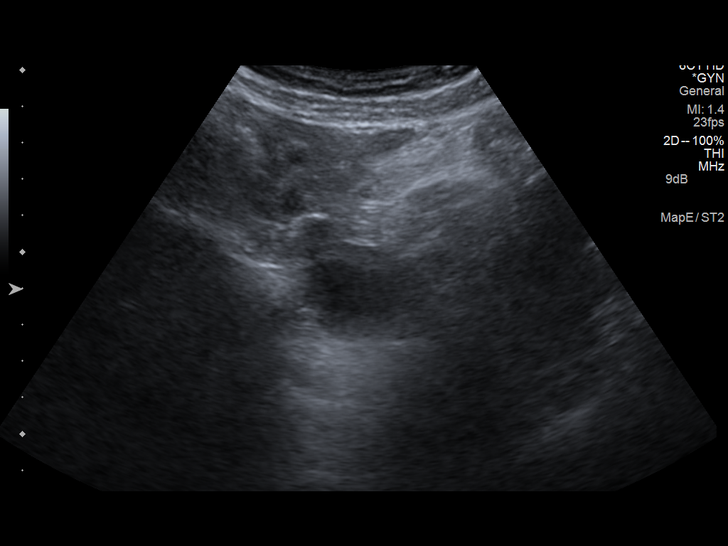
[im 18/52]
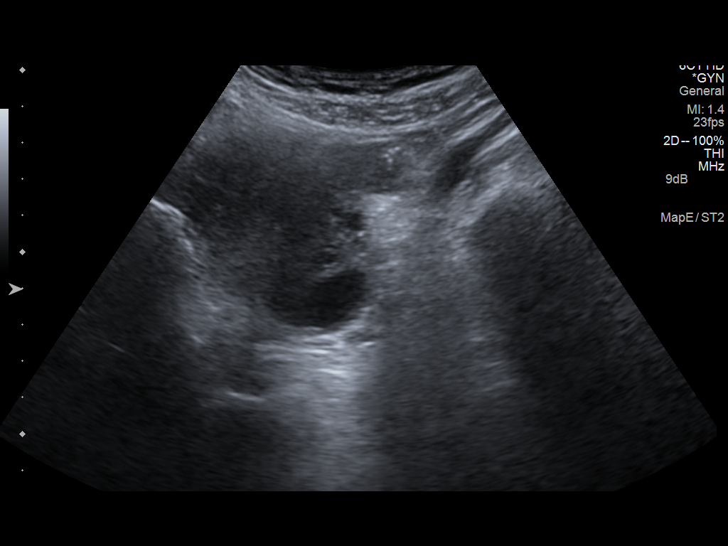
[im 22/52]
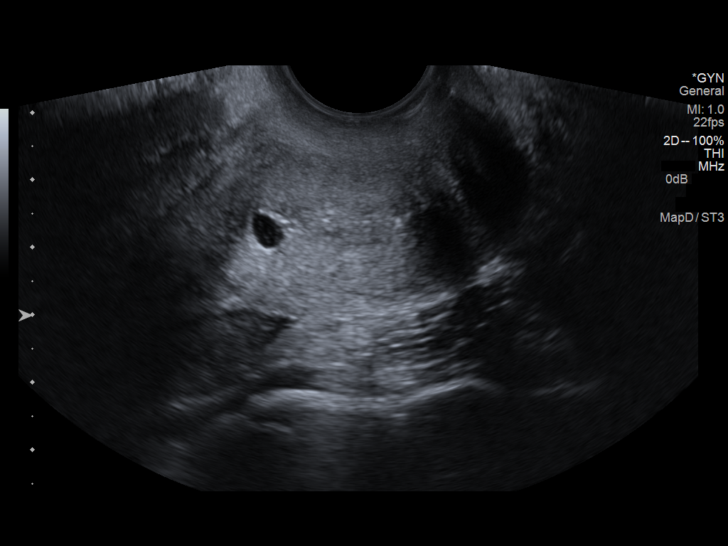
[im 26/52]
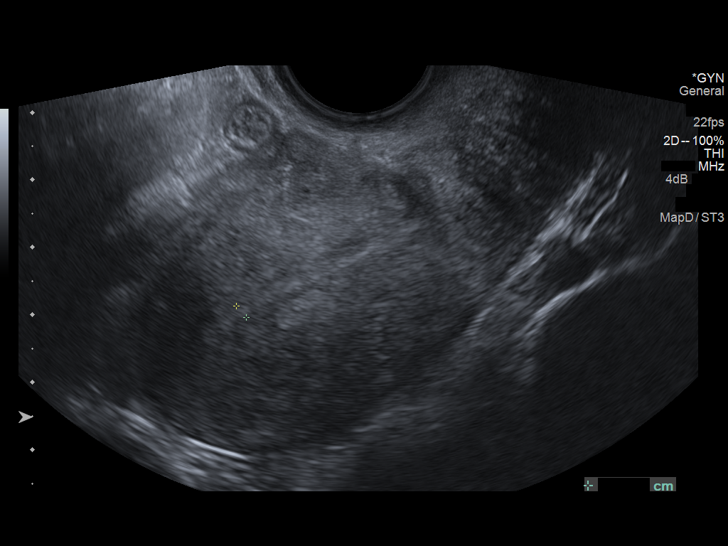
[im 30/52]
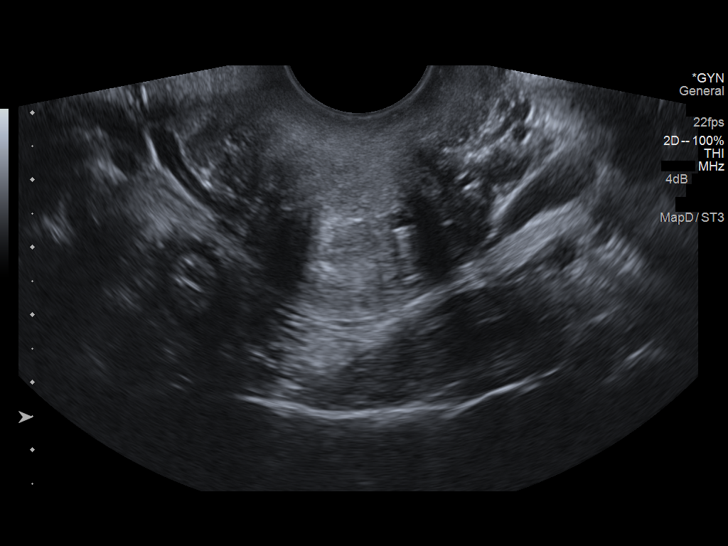
[im 35/52]
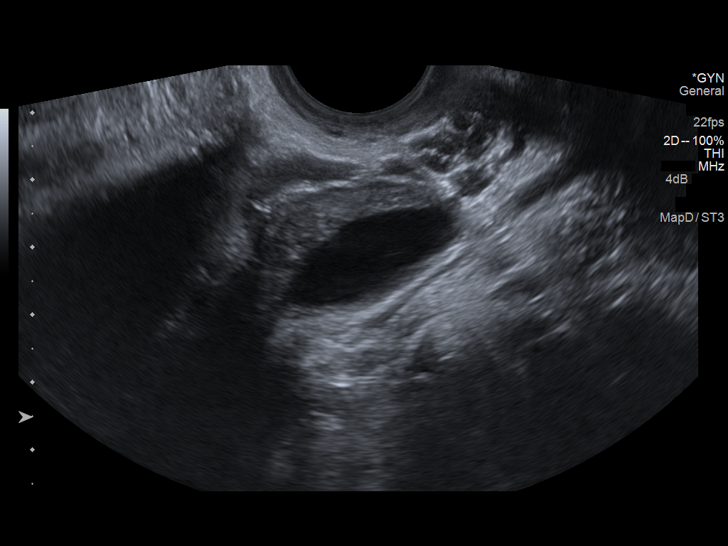
[im 39/52]
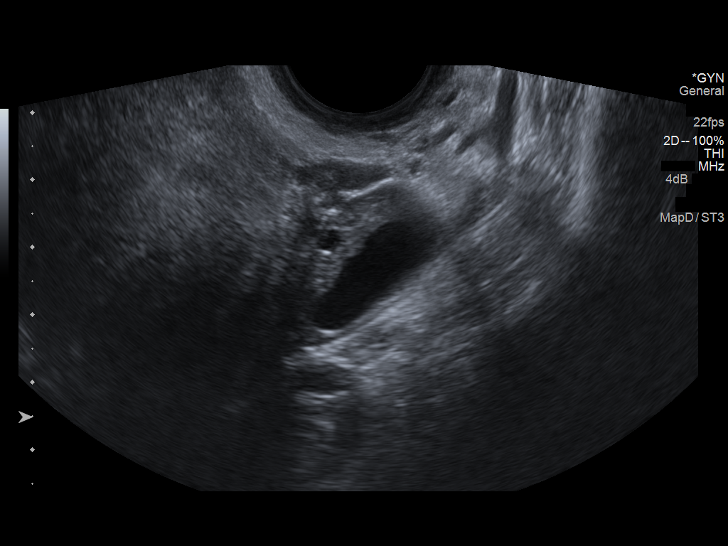
[im 43/52]
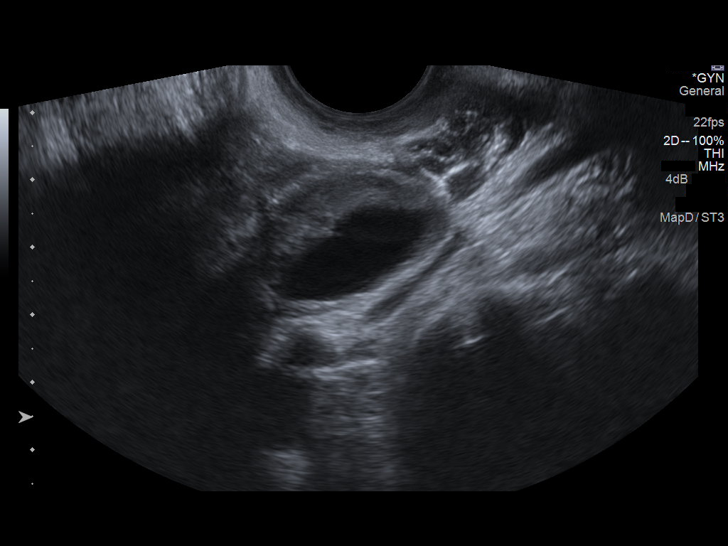
[im 47/52]
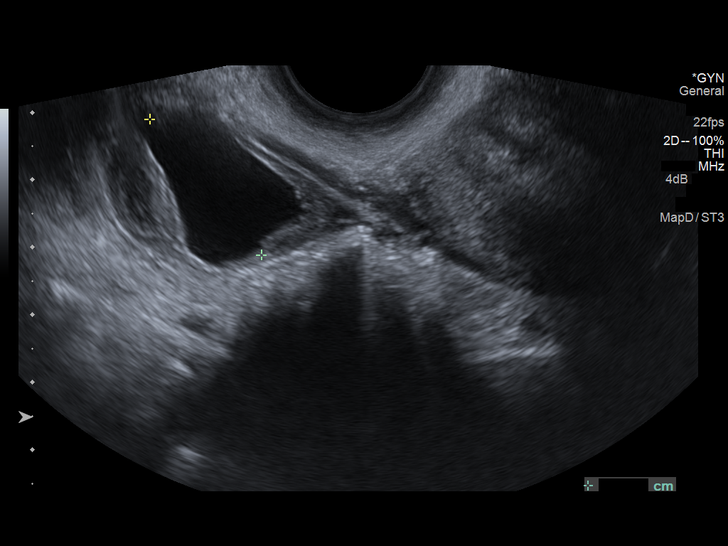
[im 52/52]
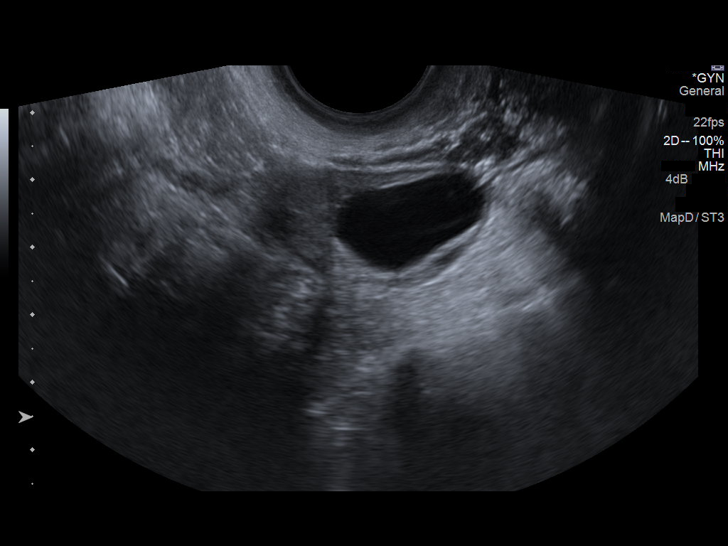

[13 of 25 positions shown; findings below may reference images not displayed]

FINDINGS: Uterus

Measurements: 9.6 x 3.4 x 5.0 cm.. No fibroids or other mass
visualized.

Endometrium

Thickness: 2.2 mm.  No focal abnormality visualized.

Right ovary

Measurements: 3.4 x 1.9 x 3.2 cm. Largest follicle is 2.6 cm.

Left ovary

Measurements: 3.2 x 2.1 x 3.0 cm.. Largest follicle is 2.8 cm.

Other findings

No free fluid.
IMPRESSION: 1. Normal appearance of the uterus/endometrium. If bleeding remains
unresponsive to hormonal or medical therapy, sonohysterogram should
be considered for focal lesion work-up. (Ref: Radiological
Reasoning: Algorithmic Workup of Abnormal Vaginal Bleeding with
Endovaginal Sonography and Sonohysterography. AJR [id number]; [id number])
2. Normal appearance of the ovaries.

## 2015-04-27 DIAGNOSIS — D509 Iron deficiency anemia, unspecified: Secondary | ICD-10-CM | POA: Diagnosis not present

## 2015-07-11 DIAGNOSIS — F339 Major depressive disorder, recurrent, unspecified: Secondary | ICD-10-CM

## 2015-07-11 HISTORY — DX: Major depressive disorder, recurrent, unspecified: F33.9

## 2015-09-03 DIAGNOSIS — F603 Borderline personality disorder: Secondary | ICD-10-CM | POA: Insufficient documentation

## 2015-09-03 HISTORY — DX: Borderline personality disorder: F60.3

## 2015-10-19 DIAGNOSIS — D509 Iron deficiency anemia, unspecified: Secondary | ICD-10-CM | POA: Diagnosis not present

## 2017-02-10 DIAGNOSIS — M5416 Radiculopathy, lumbar region: Secondary | ICD-10-CM

## 2017-02-10 HISTORY — DX: Radiculopathy, lumbar region: M54.16

## 2018-09-25 DIAGNOSIS — R42 Dizziness and giddiness: Secondary | ICD-10-CM

## 2018-09-25 DIAGNOSIS — Z720 Tobacco use: Secondary | ICD-10-CM

## 2018-09-25 HISTORY — DX: Tobacco use: Z72.0

## 2018-09-25 HISTORY — DX: Dizziness and giddiness: R42

## 2020-06-18 ENCOUNTER — Encounter: Payer: Self-pay | Admitting: General Practice

## 2020-07-07 ENCOUNTER — Other Ambulatory Visit: Payer: Self-pay

## 2020-07-07 DIAGNOSIS — C519 Malignant neoplasm of vulva, unspecified: Secondary | ICD-10-CM | POA: Insufficient documentation

## 2020-07-07 DIAGNOSIS — J449 Chronic obstructive pulmonary disease, unspecified: Secondary | ICD-10-CM | POA: Insufficient documentation

## 2020-07-07 DIAGNOSIS — I10 Essential (primary) hypertension: Secondary | ICD-10-CM | POA: Insufficient documentation

## 2020-07-09 ENCOUNTER — Ambulatory Visit: Payer: Medicaid Other | Admitting: Cardiology

## 2020-07-16 ENCOUNTER — Encounter: Payer: Self-pay | Admitting: General Practice

## 2020-10-05 ENCOUNTER — Emergency Department (HOSPITAL_COMMUNITY)
Admission: EM | Admit: 2020-10-05 | Discharge: 2020-10-06 | Disposition: A | Discharge status: Home / Self Care | Payer: Medicaid Other | Attending: Emergency Medicine | Admitting: Emergency Medicine

## 2020-10-05 ENCOUNTER — Encounter (HOSPITAL_COMMUNITY): Payer: Self-pay | Admitting: Emergency Medicine

## 2020-10-05 DIAGNOSIS — R109 Unspecified abdominal pain: Secondary | ICD-10-CM | POA: Diagnosis not present

## 2020-10-05 DIAGNOSIS — R140 Abdominal distension (gaseous): Secondary | ICD-10-CM | POA: Insufficient documentation

## 2020-10-05 DIAGNOSIS — Z5321 Procedure and treatment not carried out due to patient leaving prior to being seen by health care provider: Secondary | ICD-10-CM | POA: Insufficient documentation

## 2020-10-05 DIAGNOSIS — R110 Nausea: Secondary | ICD-10-CM | POA: Diagnosis not present

## 2020-10-05 LAB — COMPREHENSIVE METABOLIC PANEL
ALT: 13 U/L (ref 0–44)
AST: 21 U/L (ref 15–41)
Albumin: 3.4 g/dL — ABNORMAL LOW (ref 3.5–5.0)
Alkaline Phosphatase: 72 U/L (ref 38–126)
Anion gap: 7 (ref 5–15)
BUN: 20 mg/dL (ref 6–20)
CO2: 22 mmol/L (ref 22–32)
Calcium: 8.8 mg/dL — ABNORMAL LOW (ref 8.9–10.3)
Chloride: 108 mmol/L (ref 98–111)
Creatinine, Ser: 0.81 mg/dL (ref 0.44–1.00)
GFR, Estimated: >60 mL/min (ref >60)
Glucose, Bld: 96 mg/dL (ref 70–99)
Potassium: 4.2 mmol/L (ref 3.5–5.1)
Sodium: 137 mmol/L (ref 135–145)
Total Bilirubin: 0.2 mg/dL — ABNORMAL LOW (ref 0.3–1.2)
Total Protein: 6.4 g/dL — ABNORMAL LOW (ref 6.5–8.1)

## 2020-10-05 LAB — URINALYSIS, ROUTINE W REFLEX MICROSCOPIC
Bacteria, UA: NONE SEEN
Bilirubin Urine: NEGATIVE
Glucose, UA: >=500 mg/dL — AB
Ketones, ur: NEGATIVE mg/dL
Leukocytes,Ua: NEGATIVE
Nitrite: NEGATIVE
Protein, ur: NEGATIVE mg/dL
Specific Gravity, Urine: 1.029 (ref 1.005–1.030)
pH: 5 (ref 5.0–8.0)

## 2020-10-05 LAB — CBC
HCT: 46.3 % — ABNORMAL HIGH (ref 36.0–46.0)
Hemoglobin: 15.1 g/dL — ABNORMAL HIGH (ref 12.0–15.0)
MCH: 31.7 pg (ref 26.0–34.0)
MCHC: 32.6 g/dL (ref 30.0–36.0)
MCV: 97.1 fL (ref 80.0–100.0)
Platelets: 212 10*3/uL (ref 150–400)
RBC: 4.77 MIL/uL (ref 3.87–5.11)
RDW: 13.4 % (ref 11.5–15.5)
WBC: 5.6 10*3/uL (ref 4.0–10.5)
nRBC: 0 % (ref 0.0–0.2)

## 2020-10-05 LAB — LIPASE, BLOOD: Lipase: 33 U/L (ref 11–51)

## 2020-10-05 NOTE — ED Notes (Signed)
Vitals called x2

## 2020-10-05 NOTE — ED Triage Notes (Signed)
Pt. Stated, I was dx with UTI and give antibiotic 2 weeks ago but Im still having stomach pain with bloating and nausea

## 2020-10-06 NOTE — ED Notes (Signed)
Patient called for vitals+ room x6

## 2021-03-29 ENCOUNTER — Encounter: Payer: Self-pay | Admitting: Gastroenterology

## 2022-08-21 ENCOUNTER — Emergency Department (HOSPITAL_COMMUNITY)
Admission: EM | Admit: 2022-08-21 | Discharge: 2022-08-21 | Disposition: A | Discharge status: Home / Self Care | Payer: Medicaid Other | Attending: Emergency Medicine | Admitting: Emergency Medicine

## 2022-08-21 ENCOUNTER — Emergency Department (HOSPITAL_COMMUNITY): Payer: Medicaid Other

## 2022-08-21 ENCOUNTER — Other Ambulatory Visit: Payer: Self-pay

## 2022-08-21 ENCOUNTER — Encounter (HOSPITAL_COMMUNITY): Payer: Self-pay | Admitting: *Deleted

## 2022-08-21 DIAGNOSIS — Z79899 Other long term (current) drug therapy: Secondary | ICD-10-CM | POA: Diagnosis not present

## 2022-08-21 DIAGNOSIS — R112 Nausea with vomiting, unspecified: Secondary | ICD-10-CM | POA: Diagnosis present

## 2022-08-21 DIAGNOSIS — Z7982 Long term (current) use of aspirin: Secondary | ICD-10-CM | POA: Diagnosis not present

## 2022-08-21 DIAGNOSIS — R109 Unspecified abdominal pain: Secondary | ICD-10-CM | POA: Diagnosis not present

## 2022-08-21 LAB — CBC
HCT: 46.4 % — ABNORMAL HIGH (ref 36.0–46.0)
Hemoglobin: 15.8 g/dL — ABNORMAL HIGH (ref 12.0–15.0)
MCH: 33.3 pg (ref 26.0–34.0)
MCHC: 34.1 g/dL (ref 30.0–36.0)
MCV: 97.9 fL (ref 80.0–100.0)
Platelets: 204 10*3/uL (ref 150–400)
RBC: 4.74 MIL/uL (ref 3.87–5.11)
RDW: 11.9 % (ref 11.5–15.5)
WBC: 6.9 10*3/uL (ref 4.0–10.5)
nRBC: 0 % (ref 0.0–0.2)

## 2022-08-21 LAB — URINALYSIS, ROUTINE W REFLEX MICROSCOPIC
Bacteria, UA: NONE SEEN
Bilirubin Urine: NEGATIVE
Glucose, UA: NEGATIVE mg/dL
Hgb urine dipstick: NEGATIVE
Ketones, ur: NEGATIVE mg/dL
Leukocytes,Ua: NEGATIVE
Nitrite: POSITIVE — AB
Protein, ur: 30 mg/dL — AB
Specific Gravity, Urine: 1.023 (ref 1.005–1.030)
pH: 5 (ref 5.0–8.0)

## 2022-08-21 LAB — COMPREHENSIVE METABOLIC PANEL
ALT: 11 U/L (ref 0–44)
AST: 21 U/L (ref 15–41)
Albumin: 3.9 g/dL (ref 3.5–5.0)
Alkaline Phosphatase: 73 U/L (ref 38–126)
Anion gap: 10 (ref 5–15)
BUN: 27 mg/dL — ABNORMAL HIGH (ref 6–20)
CO2: 19 mmol/L — ABNORMAL LOW (ref 22–32)
Calcium: 9 mg/dL (ref 8.9–10.3)
Chloride: 107 mmol/L (ref 98–111)
Creatinine, Ser: 0.87 mg/dL (ref 0.44–1.00)
GFR, Estimated: >60 mL/min (ref >60)
Glucose, Bld: 80 mg/dL (ref 70–99)
Potassium: 4.1 mmol/L (ref 3.5–5.1)
Sodium: 136 mmol/L (ref 135–145)
Total Bilirubin: 0.4 mg/dL (ref 0.3–1.2)
Total Protein: 6.8 g/dL (ref 6.5–8.1)

## 2022-08-21 LAB — ETHANOL: Alcohol, Ethyl (B): <10 mg/dL (ref <10)

## 2022-08-21 LAB — RAPID URINE DRUG SCREEN, HOSP PERFORMED
Amphetamines: NOT DETECTED
Barbiturates: NOT DETECTED
Benzodiazepines: NOT DETECTED
Cocaine: POSITIVE — AB
Opiates: NOT DETECTED
Tetrahydrocannabinol: NOT DETECTED

## 2022-08-21 LAB — I-STAT BETA HCG BLOOD, ED (MC, WL, AP ONLY): I-stat hCG, quantitative: <5 m[IU]/mL (ref <5)

## 2022-08-21 LAB — LIPASE, BLOOD: Lipase: 34 U/L (ref 11–51)

## 2022-08-21 LAB — I-STAT CHEM 8, ED
BUN: 31 mg/dL — ABNORMAL HIGH (ref 6–20)
Calcium, Ion: 1.15 mmol/L (ref 1.15–1.40)
Chloride: 108 mmol/L (ref 98–111)
Creatinine, Ser: 0.9 mg/dL (ref 0.44–1.00)
Glucose, Bld: 79 mg/dL (ref 70–99)
HCT: 48 % — ABNORMAL HIGH (ref 36.0–46.0)
Hemoglobin: 16.3 g/dL — ABNORMAL HIGH (ref 12.0–15.0)
Potassium: 4.2 mmol/L (ref 3.5–5.1)
Sodium: 139 mmol/L (ref 135–145)
TCO2: 21 mmol/L — ABNORMAL LOW (ref 22–32)

## 2022-08-21 MED ORDER — ONDANSETRON HCL 4 MG/2ML IJ SOLN
4.0000 mg | Freq: Once | INTRAMUSCULAR | Status: AC
  Administered 2022-08-21: 4 mg via INTRAVENOUS
  Filled 2022-08-21: qty 2

## 2022-08-21 MED ORDER — KETOROLAC TROMETHAMINE 30 MG/ML IJ SOLN
30.0000 mg | Freq: Once | INTRAMUSCULAR | Status: AC
  Administered 2022-08-21: 30 mg via INTRAVENOUS
  Filled 2022-08-21: qty 1

## 2022-08-21 MED ORDER — IOHEXOL 350 MG/ML SOLN
75.0000 mL | Freq: Once | INTRAVENOUS | Status: AC | PRN
  Administered 2022-08-21: 75 mL via INTRAVENOUS

## 2022-08-21 MED ORDER — ONDANSETRON 4 MG PO TBDP: ORAL_TABLET | ORAL | 0 refills | Status: DC

## 2022-08-21 NOTE — ED Notes (Signed)
Patient transported to CT 

## 2022-08-21 NOTE — ED Provider Notes (Signed)
[facility] Provider Note   CSN: [id number] Arrival date & time: 08/21/22  1322     History  Chief Complaint  Patient presents with   Flank Pain   Nausea    Caitlin Pineda is a 52 y.o. female here presenting with flank pain and nausea.  Patient states that she has been having right flank pain for the last several days.  She went to see urgent care and was diagnosed with a UTI.  She then was prescribed antibiotic and also Pyridium.  She states that today while she was at work, she has worsening right flank pain and nausea and vomiting.  Patient denies any fevers.  Patient states that she is under a lot of stress and has been using cocaine and has been drinking alcohol.  The history is provided by the patient.       Home Medications Prior to Admission medications   Medication Sig Start Date End Date Taking? Authorizing Provider  albuterol (PROVENTIL HFA;VENTOLIN HFA) 108 (90 BASE) MCG/ACT inhaler Inhale 2 puffs into the lungs every 6 (six) hours as needed for wheezing or shortness of breath.    [provider]  albuterol (PROVENTIL HFA;VENTOLIN HFA) 108 (90 BASE) MCG/ACT inhaler Inhale 2 puffs into the lungs every 6 (six) hours as needed for wheezing.    [provider]  ARIPiprazole (ABILIFY) 5 MG tablet Take by mouth.    [provider]  Aspirin-Salicylamide-Caffeine (BC HEADACHE POWDER PO) Take 1 packet by mouth daily as needed (headache.).    [provider]  budesonide-formoterol (SYMBICORT) 160-4.5 MCG/ACT inhaler Inhale 2 puffs into the lungs 2 (two) times daily.    [provider]  buPROPion (WELLBUTRIN XL) 150 MG 24 hr tablet Take by mouth.    [provider]  busPIRone (BUSPAR) 15 MG tablet Take by mouth.    [provider]  BUTRANS 15 MCG/HR 1 patch once a week. 06/29/20   [provider]  cetirizine (ZYRTEC) 10 MG tablet Take 10 mg by mouth daily. 01/15/20    [provider]  cyclobenzaprine (FLEXERIL) 10 MG tablet Take 10 mg by mouth 3 (three) times daily as needed. 06/11/20   [provider]  FARXIGA 5 MG TABS tablet Take 5 mg by mouth daily. 06/17/20   [provider]  FLUoxetine (PROZAC) 20 MG capsule Take by mouth.    [provider]  fluticasone (FLONASE) 50 MCG/ACT nasal spray Place 1 spray into both nostrils 2 (two) times daily.    [provider]  HYDROcodone-acetaminophen (NORCO/VICODIN) 5-325 MG tablet TK 1-2 TS PO Q 4-6 H PRN P 07/22/15   [provider]  ibuprofen (ADVIL,MOTRIN) 200 MG tablet Take 200 mg by mouth every 6 (six) hours as needed.    [provider]  ibuprofen (ADVIL,MOTRIN) 800 MG tablet Take 1 tablet (800 mg total) by mouth 3 (three) times daily. 01/30/13   Noe Gens, PA-C  metroNIDAZOLE (FLAGYL) 500 MG tablet Take 500 mg by mouth 3 (three) times daily. 03/09/20   [provider]  mirtazapine (REMERON) 15 MG tablet Take by mouth. 09/04/15   [provider]  Nicotine 21-14-7 MG/24HR KIT See admin instructions. 07/31/15   [provider]  nitrofurantoin, macrocrystal-monohydrate, (MACROBID) 100 MG capsule Take 100 mg by mouth 2 (two) times daily. 05/25/20   [provider]  OXcarbazepine (TRILEPTAL) 150 MG tablet Take by mouth.    [provider]  oxyCODONE-acetaminophen (PERCOCET/ROXICET)  5-325 MG per tablet Take 1-2 pills every 4-6 hours as needed for pain. 01/30/13   Noe Gens, PA-C  pantoprazole (PROTONIX) 20 MG tablet Take by mouth.    [provider]  sucralfate (CARAFATE) 1 g tablet Take by mouth.    [provider]  tapentadol (NUCYNTA) 50 MG tablet Take by mouth. 03/09/17   [provider]  TRELEGY ELLIPTA 100-62.5-25 MCG/INH AEPB Inhale 1 puff into the lungs daily. 06/17/20   [provider]      Allergies    Prednisone    Review of Systems   Review of Systems   Genitourinary:  Positive for flank pain.  All other systems reviewed and are negative.   Physical Exam Updated Vital Signs BP 139/73   Pulse 61   Temp 98.1 F (36.7 C) (Oral)   Resp 18   Ht 5' 3"$  (1.6 m)   Wt 49.9 kg   SpO2 97%   BMI 19.49 kg/m  Physical Exam Vitals and nursing note reviewed.  Constitutional:      Appearance: Normal appearance.  HENT:     Head: Normocephalic.     Nose: Nose normal.     Mouth/Throat:     Mouth: Mucous membranes are dry.  Eyes:     Extraocular Movements: Extraocular movements intact.     Pupils: Pupils are equal, round, and reactive to light.  Cardiovascular:     Rate and Rhythm: Normal rate and regular rhythm.     Pulses: Normal pulses.     Heart sounds: Normal heart sounds.  Pulmonary:     Effort: Pulmonary effort is normal.     Breath sounds: Normal breath sounds.  Abdominal:     General: Abdomen is flat.     Palpations: Abdomen is soft.     Comments: Mild epigastric tenderness and right CVAT.  Musculoskeletal:        General: Normal range of motion.     Cervical back: Normal range of motion and neck supple.  Skin:    General: Skin is warm.     Capillary Refill: Capillary refill takes less than 2 seconds.  Neurological:     General: No focal deficit present.     Mental Status: She is alert and oriented to person, place, and time.  Psychiatric:        Mood and Affect: Mood normal.        Behavior: Behavior normal.     ED Results / Procedures / Treatments   Labs (all labs ordered are listed, but only abnormal results are displayed) Labs Reviewed  CBC - Abnormal; Notable for the following components:      Result Value   Hemoglobin 15.8 (*)    HCT 46.4 (*)    All other components within normal limits  URINALYSIS, ROUTINE W REFLEX MICROSCOPIC - Abnormal; Notable for the following components:   Color, Urine AMBER (*)    Protein, ur 30 (*)    Nitrite POSITIVE (*)    All other components within normal limits  RAPID URINE  DRUG SCREEN, HOSP PERFORMED - Abnormal; Notable for the following components:   Cocaine POSITIVE (*)    All other components within normal limits  I-STAT CHEM 8, ED - Abnormal; Notable for the following components:   BUN 31 (*)    TCO2 21 (*)    Hemoglobin 16.3 (*)    HCT 48.0 (*)    All other components within normal limits  URINE CULTURE  ETHANOL  LIPASE, BLOOD  COMPREHENSIVE METABOLIC PANEL  I-STAT BETA HCG BLOOD, ED ([facility], [facility], [facility] ONLY)    EKG None  Radiology No results found.  Procedures Procedures    Medications Ordered in ED Medications  ondansetron (ZOFRAN) injection 4 mg (4 mg Intravenous Given 08/21/22 1529)  ketorolac (TORADOL) 30 MG/ML injection 30 mg (30 mg Intravenous Given 08/21/22 1529)  iohexol (OMNIPAQUE) 350 MG/ML injection 75 mL (75 mLs Intravenous Contrast Given 08/21/22 1604)    ED Course/ Medical Decision Making/ A&P                             Medical Decision Making KAISY JAKAB is a 52 y.o. female here presenting with right flank pain.  Concern for possible pyelonephritis versus renal colic versus alcohol gastritis.  Plan to get CBC and CMP and UA and CT abdomen pelvis.  Will hydrate and reassess.  5:01 PM Reviewed patient's labs and independently interpreted imaging studies.  Patient's labs were unremarkable.  Patient's UDS is positive for cocaine.  Urinalysis showed no bacteria consistent with treated UTI.  CT abdomen pelvis did not show any stone or pyelonephritis.  Patient was given Toradol and IV fluids and Zofran and felt better.  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  I told her to finish her antibiotics as prescribed.  Will give her Zofran as needed and a work note  Problems Addressed: Flank pain: acute illness or injury Nausea and vomiting, unspecified vomiting type: acute illness or injury  Amount and/or Complexity of Data Reviewed Labs: ordered. Decision-making details documented in ED  Course. Radiology: ordered and independent interpretation performed. Decision-making details documented in ED Course.  Risk Prescription drug management.    Final Clinical Impression(s) / ED Diagnoses Final diagnoses:  None    Rx / DC Orders ED Discharge Orders     None         Drenda Freeze, MD 08/21/22 1702

## 2022-08-21 NOTE — ED Triage Notes (Signed)
Pt states started antibiotics for kidney infection on Friday.  States initial improvement, but now pain is increasing again.  Also expresses excessive drinking and some cocaine d/t stress r/t taking care of her father with dementia and working full time.

## 2022-08-21 NOTE — Discharge Instructions (Signed)
Stay hydrated.  Take Zofran as needed for nausea.  As we discussed, you likely have a stomach virus.  I recommend you rest for 2 days at home.  Please finish your antibiotics as prescribed by your doctor  See your doctor for follow-up  Return to ER if you have worse flank pain, abdominal pain, vomiting

## 2022-08-22 LAB — URINE CULTURE: Culture: NO GROWTH

## 2023-04-13 ENCOUNTER — Ambulatory Visit: Payer: Medicaid Other | Admitting: Family Medicine

## 2023-04-13 ENCOUNTER — Encounter: Payer: Self-pay | Admitting: Family Medicine

## 2023-04-13 ENCOUNTER — Ambulatory Visit (HOSPITAL_BASED_OUTPATIENT_CLINIC_OR_DEPARTMENT_OTHER)
Admission: RE | Admit: 2023-04-13 | Discharge: 2023-04-13 | Disposition: A | Discharge status: Home / Self Care | Payer: Medicaid Other | Source: Ambulatory Visit | Attending: Family Medicine | Admitting: Family Medicine

## 2023-04-13 VITALS — BP 124/79 | HR 90 | Resp 18 | Ht 63.0 in | Wt 121.6 lb

## 2023-04-13 DIAGNOSIS — M5416 Radiculopathy, lumbar region: Secondary | ICD-10-CM

## 2023-04-13 DIAGNOSIS — F603 Borderline personality disorder: Secondary | ICD-10-CM | POA: Diagnosis not present

## 2023-04-13 DIAGNOSIS — K1370 Unspecified lesions of oral mucosa: Secondary | ICD-10-CM | POA: Diagnosis not present

## 2023-04-13 DIAGNOSIS — R053 Chronic cough: Secondary | ICD-10-CM

## 2023-04-13 DIAGNOSIS — Z720 Tobacco use: Secondary | ICD-10-CM | POA: Insufficient documentation

## 2023-04-13 DIAGNOSIS — R739 Hyperglycemia, unspecified: Secondary | ICD-10-CM

## 2023-04-13 DIAGNOSIS — R1310 Dysphagia, unspecified: Secondary | ICD-10-CM

## 2023-04-13 DIAGNOSIS — J449 Chronic obstructive pulmonary disease, unspecified: Secondary | ICD-10-CM | POA: Diagnosis present

## 2023-04-13 DIAGNOSIS — D582 Other hemoglobinopathies: Secondary | ICD-10-CM

## 2023-04-13 DIAGNOSIS — K219 Gastro-esophageal reflux disease without esophagitis: Secondary | ICD-10-CM

## 2023-04-13 DIAGNOSIS — E538 Deficiency of other specified B group vitamins: Secondary | ICD-10-CM

## 2023-04-13 DIAGNOSIS — Z1159 Encounter for screening for other viral diseases: Secondary | ICD-10-CM

## 2023-04-13 DIAGNOSIS — Z1231 Encounter for screening mammogram for malignant neoplasm of breast: Secondary | ICD-10-CM

## 2023-04-13 DIAGNOSIS — F339 Major depressive disorder, recurrent, unspecified: Secondary | ICD-10-CM | POA: Diagnosis not present

## 2023-04-13 DIAGNOSIS — Z114 Encounter for screening for human immunodeficiency virus [HIV]: Secondary | ICD-10-CM

## 2023-04-13 HISTORY — DX: Hyperglycemia, unspecified: R73.9

## 2023-04-13 HISTORY — DX: Chronic cough: R05.3

## 2023-04-13 HISTORY — DX: Gastro-esophageal reflux disease without esophagitis: K21.9

## 2023-04-13 MED ORDER — ALBUTEROL SULFATE HFA 108 (90 BASE) MCG/ACT IN AERS: 2.0000 | INHALATION_SPRAY | Freq: Four times a day (QID) | RESPIRATORY_TRACT | 5 refills | Status: AC | PRN

## 2023-04-13 MED ORDER — TRELEGY ELLIPTA 100-62.5-25 MCG/ACT IN AEPB: 1.0000 | INHALATION_SPRAY | Freq: Every day | RESPIRATORY_TRACT | 11 refills | Status: DC

## 2023-04-13 MED ORDER — PANTOPRAZOLE SODIUM 20 MG PO TBEC: 20.0000 mg | DELAYED_RELEASE_TABLET | Freq: Every day | ORAL | 3 refills | Status: DC

## 2023-04-13 NOTE — Assessment & Plan Note (Signed)
Smoking cessation encouraged, not ready to quit

## 2023-04-13 NOTE — Patient Instructions (Signed)
Thank you for choosing [facility] for your Primary Care needs. I am excited for the opportunity to partner with you to meet your health care goals. It was a pleasure meeting you today!  Information on diet, exercise, and health maintenance recommendations are listed below. This is information to help you be sure you are on track for optimal health and monitoring.   Please look over this and let us know if you have any questions or if you have completed any of the health maintenance outside of [place] so that we can be sure your records are up to date.  ___________________________________________________________  MyChart:  For all urgent or time sensitive needs we ask that you please call the office to avoid delays. Our number is [phone]. MyChart is not constantly monitored and due to the large volume of messages a day, replies may take up to 72 business hours.  MyChart Policy: MyChart allows for you to see your visit notes, after visit summary, provider recommendations, lab and tests results, make an appointment, request refills, and contact your provider or the office for non-urgent questions or concerns. Providers are seeing patients during normal business hours and do not have built in time to review MyChart messages.  We ask that you allow a minimum of 3 business days for responses to MyChart messages. For this reason, please do not send urgent requests through MyChart. Please call the office at [phone]. New and ongoing conditions may require a visit. We have virtual and in-person visits available for your convenience.  Complex MyChart concerns may require a visit. Your provider may request you schedule a virtual or in-person visit to ensure we are providing the best care possible. MyChart messages sent after 11:00 AM on Friday will not be received by the provider until Monday morning.    Lab and Test Results: You will receive your lab and test  results on MyChart as soon as they are completed and results have been sent by the lab or testing facility. Due to this service, you will receive your results BEFORE your provider.  I review lab and test results each morning prior to seeing patients. Some results require collaboration with other providers to ensure you are receiving the most appropriate care. For this reason, we ask that you please allow a minimum of 3-5 business days from the time that ALL results have been received for your provider to receive and review lab and test results and contact you about these.  Most lab and test result comments from the provider will be sent through MyChart. Your provider may recommend changes to the plan of care, follow-up visits, repeat testing, ask questions, or request an office visit to discuss these results. You may reply directly to this message or call the office to provide information for the provider or set up an appointment. In some instances, you will be called with test results and recommendations. Please let us know if this is preferred and we will make note of this in your chart to provide this for you.    If you have not heard a response to your lab or test results in 5 business days from all results returning to MyChart, please call the office to let us know. We ask that you please avoid calling prior to this time unless there is an emergent concern. Due to high call volumes, this can delay the resulting process.  After Hours: For all non-emergency after hours needs, please   call the office at [phone] and select the option to reach the on-call  service. On-call services are shared between multiple [facility] offices and therefore it will not be possible to speak directly with your provider. On-call providers may provide medical advice and recommendations, but are unable to provide refills for maintenance medications.  For all emergency or urgent medical needs after normal business hours, we  recommend that you seek care at the closest Urgent Care or Emergency Department to ensure appropriate treatment in a timely manner.  [facility] has a 24 hour emergency room located on the ground floor for your convenience.   Urgent Concerns During the Business Day Providers are seeing patients from 8AM to 5PM with a busy schedule and are most often not able to respond to non-urgent calls until the end of the day or the next business day. If you should have URGENT concerns during the day, please call and speak to the nurse or schedule a same day appointment so that we can address your concern without delay.   Thank you, again, for choosing me as your health care partner. I appreciate your trust and look forward to learning more about you!   Darcey Demma B. Nhi Butrum, DNP, FNP-C  ___________________________________________________________  Health Maintenance Recommendations Screening Testing Mammogram Every 1-2 years based on history and risk factors Starting at age 50 Pap Smear Ages 21-39 every 3 years Ages 30-65 every 5 years with HPV testing More frequent testing may be required based on results and history Colon Cancer Screening Every 1-10 years based on test performed, risk factors, and history Starting at age 45 Bone Density Screening Every 2-10 years based on history Starting at age 65 for women Recommendations for men differ based on medication usage, history, and risk factors AAA Screening One time ultrasound Men 65-75 years old who have ever smoked Lung Cancer Screening Low Dose Lung CT every 12 months Age 50-80 years with a 20 pack-year smoking history who still smoke or who have quit within the last 15 years  Screening Labs Routine  Labs: Complete Blood Count (CBC), Complete Metabolic Panel (CMP), Cholesterol (Lipid Panel) Every 6-12 months based on history and medications May be recommended more frequently based on current conditions or previous results Hemoglobin  A1c Lab Every 3-12 months based on history and previous results Starting at age 45 or earlier with diagnosis of diabetes, high cholesterol, BMI >26, and/or risk factors Frequent monitoring for patients with diabetes to ensure blood sugar control Thyroid Panel  Every 6 months based on history, symptoms, and risk factors May be repeated more often if on medication HIV One time testing for all patients 13 and older May be repeated more frequently for patients with increased risk factors or exposure Hepatitis C One time testing for all patients 18 and older May be repeated more frequently for patients with increased risk factors or exposure Gonorrhea, Chlamydia Every 12 months for all sexually active persons 13-24 years Additional monitoring may be recommended for those who are considered high risk or who have symptoms PSA Men 40-54 years old with risk factors Additional screening may be recommended from age 55-69 based on risk factors, symptoms, and history  Vaccine Recommendations Tetanus Booster All adults every 10 years Flu Vaccine All patients 6 months and older every year COVID Vaccine All patients 12 years and older Initial dosing with booster May recommend additional booster based on age and health history HPV Vaccine 2 doses all patients age 9-26 Dosing may be considered   for patients over 26 Shingles Vaccine (Shingrix) 2 doses all adults 50 years and older Pneumonia (Pneumovax 23) All adults 65 years and older May recommend earlier dosing based on health history Pneumonia (Prevnar 13) All adults 65 years and older Dosed 1 year after Pneumovax 23 Pneumonia (Prevnar 20) All adults 65 years and older (adults 19-64 with certain conditions or risk factors) 1 dose  For those who have not received Prevnar 13 vaccine previously   Additional Screening, Testing, and Vaccinations may be recommended on an individualized basis based on family history, health history, risk  factors, and/or exposure.  __________________________________________________________  Diet Recommendations for All Patients  I recommend that all patients maintain a diet low in saturated fats, carbohydrates, and cholesterol. While this can be challenging at first, it is not impossible and small changes can make big differences.  Things to try: Decreasing the amount of soda, sweet tea, and/or juice to one or less per day and replace with water While water is always the first choice, if you do not like water you may consider adding a water additive without sugar to improve the taste other sugar free drinks Replace potatoes with a brightly colored vegetable  Use healthy oils, such as canola oil or olive oil, instead of butter or hard margarine Limit your bread intake to two pieces or less a day Replace regular pasta with low carb pasta options Bake, broil, or grill foods instead of frying Monitor portion sizes  Eat smaller, more frequent meals throughout the day instead of large meals  An important thing to remember is, if you love foods that are not great for your health, you don't have to give them up completely. Instead, allow these foods to be a reward when you have done well. Allowing yourself to still have special treats every once in a while is a nice way to tell yourself thank you for working hard to keep yourself healthy.   Also remember that every day is a new day. If you have a bad day and "fall off the wagon", you can still climb right back up and keep moving along on your journey!  We have resources available to help you!  Some websites that may be helpful include: www.MyPlate.gov  Www.VeryWellFit.com _____________________________________________________________  Activity Recommendations for All Patients  I recommend that all adults get at least 20 minutes of moderate physical activity that elevates your heart rate at least 5 days out of the week.  Some examples  include: Walking or jogging at a pace that allows you to carry on a conversation Cycling (stationary bike or outdoors) Water aerobics Yoga Weight lifting Dancing If physical limitations prevent you from putting stress on your joints, exercise in a pool or seated in a chair are excellent options.  Do determine your MAXIMUM heart rate for activity: 220 - YOUR AGE = MAX Heart Rate   Remember! Do not push yourself too hard.  Start slowly and build up your pace, speed, weight, time in exercise, etc.  Allow your body to rest between exercise and get good sleep. You will need more water than normal when you are exerting yourself. Do not wait until you are thirsty to drink. Drink with a purpose of getting in at least 8, 8 ounce glasses of water a day plus more depending on how much you exercise and sweat.    If you begin to develop dizziness, chest pain, abdominal pain, jaw pain, shortness of breath, headache, vision changes, lightheadedness, or other concerning symptoms,   stop the activity and allow your body to rest. If your symptoms are severe, seek emergency evaluation immediately. If your symptoms are concerning, but not severe, please let us know so that we can recommend further evaluation.     

## 2023-04-13 NOTE — Progress Notes (Signed)
New Patient Office Visit  Subjective    Patient ID: Caitlin Pineda, female    DOB: Mar 13, 1971  Age: 52 y.o. MRN: [id number]  CC:  Chief Complaint  Patient presents with   Establish Care    Bad cough onset 3 months  Pt does have COPD  2 red spots on both sides of tongue onset 2 months  Roof of mouth is white, pt is not able to swallow food  Pt would like a new pain management clinic  Pt would like pain patches and hydrocodone     HPI Caitlin Pineda presents to establish care   Discussed the use of AI scribe software for clinical note transcription with the patient, who gave verbal consent to proceed.  History of Present Illness   The patient, with a history of degenerative disc disease, arthritis, and neuropathy, presents with severe back pain. They report that their previous pain management regimen, which included Butrans patches and Norco, is no longer effective. The patient also mentions that they have been experiencing significant discomfort in their mouth for the past two months, including redness in the throat, sores on the tongue, and a white discoloration on the roof of the mouth. They report no pain associated with these oral symptoms but express concern due to their history of vulvar cancer and a family history of lung cancer.  The patient also reports difficulty swallowing, requiring them to drink large amounts of fluid to help food and pills go down. They have a history of bipolar disorder, for which they have previously been on several medications, but are not currently under the care of a specialist. They also have a history of COPD and are a current smoker. They have been experiencing a persistent cough for the past three months, which has not improved with antibiotics.  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 This, along with their work commitments, has made it challenging for them to attend regular  appointments and classes for their bipolar disorder. They express a desire for a new pain management doctor due to issues with their previous provider, including missed appointments due to lack of transportation and difficulty obtaining medication refills.               Outpatient Encounter Medications as of 04/13/2023  Medication Sig   Fluticasone-Umeclidin-Vilant (TRELEGY ELLIPTA) 100-62.5-25 MCG/ACT AEPB Inhale 1 puff into the lungs daily.   albuterol (VENTOLIN HFA) 108 (90 Base) MCG/ACT inhaler Inhale 2 puffs into the lungs every 6 (six) hours as needed for wheezing or shortness of breath.   BUTRANS 15 MCG/HR 1 patch once a week. (Patient not taking: Reported on 04/13/2023)   HYDROcodone-acetaminophen (NORCO/VICODIN) 5-325 MG tablet TK 1-2 TS PO Q 4-6 H PRN P (Patient not taking: Reported on 04/13/2023)   pantoprazole (PROTONIX) 20 MG tablet Take 1 tablet (20 mg total) by mouth daily.   [DISCONTINUED] albuterol (PROVENTIL HFA;VENTOLIN HFA) 108 (90 BASE) MCG/ACT inhaler Inhale 2 puffs into the lungs every 6 (six) hours as needed for wheezing or shortness of breath. (Patient not taking: Reported on 04/13/2023)   [DISCONTINUED] albuterol (PROVENTIL HFA;VENTOLIN HFA) 108 (90 BASE) MCG/ACT inhaler Inhale 2 puffs into the lungs every 6 (six) hours as needed for wheezing. (Patient not taking: Reported on 04/13/2023)   [DISCONTINUED] ARIPiprazole (ABILIFY) 5 MG tablet Take by mouth. (Patient not taking: Reported on 04/13/2023)   [DISCONTINUED] Aspirin-Salicylamide-Caffeine (BC HEADACHE POWDER PO) Take 1 packet by  mouth daily as needed (headache.). (Patient not taking: Reported on 04/13/2023)   [DISCONTINUED] budesonide-formoterol (SYMBICORT) 160-4.5 MCG/ACT inhaler Inhale 2 puffs into the lungs 2 (two) times daily. (Patient not taking: Reported on 04/13/2023)   [DISCONTINUED] buPROPion (WELLBUTRIN XL) 150 MG 24 hr tablet Take by mouth. (Patient not taking: Reported on 04/13/2023)   [DISCONTINUED]  busPIRone (BUSPAR) 15 MG tablet Take by mouth. (Patient not taking: Reported on 04/13/2023)   [DISCONTINUED] cetirizine (ZYRTEC) 10 MG tablet Take 10 mg by mouth daily. (Patient not taking: Reported on 04/13/2023)   [DISCONTINUED] cyclobenzaprine (FLEXERIL) 10 MG tablet Take 10 mg by mouth 3 (three) times daily as needed. (Patient not taking: Reported on 04/13/2023)   [DISCONTINUED] FARXIGA 5 MG TABS tablet Take 5 mg by mouth daily. (Patient not taking: Reported on 04/13/2023)   [DISCONTINUED] FLUoxetine (PROZAC) 20 MG capsule Take by mouth. (Patient not taking: Reported on 04/13/2023)   [DISCONTINUED] fluticasone (FLONASE) 50 MCG/ACT nasal spray Place 1 spray into both nostrils 2 (two) times daily. (Patient not taking: Reported on 04/13/2023)   [DISCONTINUED] ibuprofen (ADVIL,MOTRIN) 200 MG tablet Take 200 mg by mouth every 6 (six) hours as needed. (Patient not taking: Reported on 04/13/2023)   [DISCONTINUED] ibuprofen (ADVIL,MOTRIN) 800 MG tablet Take 1 tablet (800 mg total) by mouth 3 (three) times daily. (Patient not taking: Reported on 04/13/2023)   [DISCONTINUED] metroNIDAZOLE (FLAGYL) 500 MG tablet Take 500 mg by mouth 3 (three) times daily. (Patient not taking: Reported on 04/13/2023)   [DISCONTINUED] mirtazapine (REMERON) 15 MG tablet Take by mouth. (Patient not taking: Reported on 04/13/2023)   [DISCONTINUED] Nicotine 21-14-7 MG/24HR KIT See admin instructions. (Patient not taking: Reported on 04/13/2023)   [DISCONTINUED] nitrofurantoin, macrocrystal-monohydrate, (MACROBID) 100 MG capsule Take 100 mg by mouth 2 (two) times daily. (Patient not taking: Reported on 04/13/2023)   [DISCONTINUED] ondansetron (ZOFRAN-ODT) 4 MG disintegrating tablet 4mg  ODT q4 hours prn nausea/vomit (Patient not taking: Reported on 04/13/2023)   [DISCONTINUED] OXcarbazepine (TRILEPTAL) 150 MG tablet Take by mouth. (Patient not taking: Reported on 04/13/2023)   [DISCONTINUED] oxyCODONE-acetaminophen (PERCOCET/ROXICET) 5-325 MG per  tablet Take 1-2 pills every 4-6 hours as needed for pain. (Patient not taking: Reported on 04/13/2023)   [DISCONTINUED] pantoprazole (PROTONIX) 20 MG tablet Take by mouth. (Patient not taking: Reported on 04/13/2023)   [DISCONTINUED] sucralfate (CARAFATE) 1 g tablet Take by mouth. (Patient not taking: Reported on 04/13/2023)   [DISCONTINUED] tapentadol (NUCYNTA) 50 MG tablet Take by mouth. (Patient not taking: Reported on 04/13/2023)   [DISCONTINUED] TRELEGY ELLIPTA 100-62.5-25 MCG/INH AEPB Inhale 1 puff into the lungs daily. (Patient not taking: Reported on 04/13/2023)   No facility-administered encounter medications on file as of 04/13/2023.    Past Medical History:  Diagnosis Date   Borderline personality disorder (HCC) 09/03/2015   COPD (chronic obstructive pulmonary disease) (HCC)    Disequilibrium 09/25/2018   Hypertension    Lumbar radiculopathy 02/10/2017   Formatting of this note might be different from the original. Added automatically from request for surgery [id number]  Formatting of this note might be different from the original. Added automatically from request for surgery [id number]   Major depression, recurrent (HCC) 07/11/2015   Tobacco abuse 09/25/2018   Vulva cancer [facility])     Past Surgical History:  Procedure Laterality Date   CESAREAN SECTION     laser surgery for vulva cancer     TUBAL LIGATION      Family History  Problem Relation Age of Onset   Hypertension Mother  Hypertension Father    Diabetes Father    COPD Father    Gout Father     Social History   Socioeconomic History   Marital status: Single    Spouse name: Not on file   Number of children: Not on file   Years of education: Not on file   Highest education level: Not on file  Occupational History   Not on file  Tobacco Use   Smoking status: Every Day    Current packs/day: 1.50    Types: Cigarettes   Smokeless tobacco: Never  Substance and Sexual Activity   Alcohol use: Yes    Comment: 1 pint  liquor every afternoon   Drug use: Yes    Types: Cocaine   Sexual activity: Not on file  Other Topics Concern   Not on file  Social History Narrative   ** Merged History Encounter **       Social Determinants of Health   Financial Resource Strain: Not on file  Food Insecurity: No Food Insecurity (12/24/2020)   Received from [facility], [facility]   Hunger Vital Sign    Worried About Running Out of Food in the Last Year: Never true    Ran Out of Food in the Last Year: Never true  Transportation Needs: Not on file  Physical Activity: Not on file  Stress: Not on file  Social Connections: Unknown (11/22/2021)   Received from [facility], [facility]   Social Network    Social Network: Not on file  Intimate Partner Violence: Unknown (10/14/2021)   Received from [facility], [facility]   HITS    Physically Hurt: Not on file    Insult or Talk Down To: Not on file    Threaten Physical Harm: Not on file    Scream or Curse: Not on file    ROS All review of systems negative except what is listed in the HPI      Objective    BP 124/79 (BP Location: Right Arm, Patient Position: Sitting, Cuff Size: Normal)   Pulse 90   Resp 18   Ht 5\' 3"  (1.6 m)   Wt 121 lb 9.6 oz (55.2 kg)   SpO2 98%   BMI 21.54 kg/m   Physical Exam Vitals reviewed.  Constitutional:      Appearance: Normal appearance.  HENT:     Mouth/Throat:     Mouth: Mucous membranes are moist.     Pharynx: Posterior oropharyngeal erythema present. No oropharyngeal exudate.     Comments: A few small red inflamed lesions to tongue, upper gums Eyes:     Conjunctiva/sclera: Conjunctivae normal.  Cardiovascular:     Rate and Rhythm: Normal rate and regular rhythm.  Pulmonary:     Effort: No respiratory distress.     Breath sounds: No wheezing.     Comments: Mildly diminished Musculoskeletal:     Cervical back: Normal range of motion and neck supple.  Skin:    General: Skin is warm and dry.   Neurological:     Mental Status: She is alert and oriented to person, place, and time.  Psychiatric:        Mood and Affect: Mood normal.        Behavior: Behavior normal.        Thought Content: Thought content normal.        Judgment: Judgment normal.         Assessment & Plan:   Problem List Items Addressed This  Visit       Active Problems   COPD (chronic obstructive pulmonary disease) (HCC)    Patient with history of COPD, not currently on inhalers. Reports chronic cough for the past 3 months.  -Resume Trelegy inhaler as tolerated and PRN albuterol. -Chest xray -Pulmonology referral      Relevant Medications   albuterol (VENTOLIN HFA) 108 (90 Base) MCG/ACT inhaler   Fluticasone-Umeclidin-Vilant (TRELEGY ELLIPTA) 100-62.5-25 MCG/ACT AEPB   Other Relevant Orders   Ambulatory referral to Pulmonology   DG Chest 2 View   Borderline personality disorder (HCC)    No meds currently Reports mood is stable No SI/HI Refer to [facility]      Relevant Orders   Ambulatory referral to Behavioral Health   Lumbar radiculopathy    Patient with history of degenerative disc disease and arthritis, most recently managed with Butrans patches and Norco [facility]), reports increased pain and difficulty maintaining employment due to pain. Recent issues with previous pain management clinic leading to discontinuation of medications. -Referral to new pain management clinic for evaluation and management.      Relevant Orders   Ambulatory referral to Pain Clinic   Major depression, recurrent (HCC)    Stable. No SI/HI No current meds Refer to [facility]      Relevant Orders   Ambulatory referral to Behavioral Health   Tobacco abuse    Smoking cessation encouraged, not ready to quit      Relevant Orders   Lipid panel   Ambulatory Referral Lung Cancer Screening [facility]   DG Chest 2 View   Hyperglycemia    Hx of taking Farxiga Repeat labs today       Relevant Orders    Comprehensive metabolic panel   Hemoglobin A1c   TSH   Gastroesophageal reflux disease    Patient reports ongoing reflux symptoms. -Refill Protonix.      Relevant Medications   pantoprazole (PROTONIX) 20 MG tablet   Chronic cough - Primary    3 months of new cough Current smoker Chest xray Referral to pulmonology Refill COPD inhalers      Relevant Orders   Ambulatory referral to Pulmonology   CBC with Differential/Platelet   Comprehensive metabolic panel   DG Chest 2 View   Other Visit Diagnoses     Unspecified lesions of oral mucosa     Dysphagia, unspecified type  New onset oral lesions and dysphagia, with associated discomfort in throat and recent voice changes. No associated weight loss reported. Patient has history of smoking and previous vulvar cancer. -Referral to Ear, Nose, and Throat specialist for evaluation.   Relevant Orders   Ambulatory referral to ENT   B12 and Folate Panel         Relevant Orders   Ambulatory referral to ENT   Encounter for screening mammogram for malignant neoplasm of breast       Relevant Orders   MM 3D SCREENING MAMMOGRAM BILATERAL BREAST   Encounter for screening for HIV       Relevant Orders   HIV Antibody (routine testing w rflx)   Encounter for hepatitis C screening test for low risk patient       Relevant Orders   Hepatitis C antibody       Return in about 3 months (around 07/14/2023) for routine follow-up.   Clayborne Dana, NP

## 2023-04-13 NOTE — Assessment & Plan Note (Signed)
Patient with history of degenerative disc disease and arthritis, most recently managed with Butrans patches and Norco [facility]), reports increased pain and difficulty maintaining employment due to pain. Recent issues with previous pain management clinic leading to discontinuation of medications. -Referral to new pain management clinic for evaluation and management.

## 2023-04-13 NOTE — Assessment & Plan Note (Signed)
No meds currently Reports mood is stable No SI/HI Refer to [facility]

## 2023-04-13 NOTE — Assessment & Plan Note (Signed)
Patient reports ongoing reflux symptoms. -Refill Protonix.

## 2023-04-13 NOTE — Assessment & Plan Note (Signed)
Stable. No SI/HI No current meds Refer to [facility]

## 2023-04-13 NOTE — Assessment & Plan Note (Signed)
3 months of new cough Current smoker Chest xray Referral to pulmonology Refill COPD inhalers

## 2023-04-13 NOTE — Assessment & Plan Note (Signed)
Patient with history of COPD, not currently on inhalers. Reports chronic cough for the past 3 months.  -Resume Trelegy inhaler as tolerated and PRN albuterol. -Chest xray -Pulmonology referral

## 2023-04-13 NOTE — Assessment & Plan Note (Signed)
Hx of taking Farxiga Repeat labs today

## 2023-04-14 LAB — CBC WITH DIFFERENTIAL/PLATELET
Basophils Absolute: 0 10*3/uL (ref 0.0–0.1)
Basophils Relative: 0.4 % (ref 0.0–3.0)
Eosinophils Absolute: 0.3 10*3/uL (ref 0.0–0.7)
Eosinophils Relative: 3.8 % (ref 0.0–5.0)
HCT: 51 % — ABNORMAL HIGH (ref 36.0–46.0)
Hemoglobin: 16.5 g/dL — ABNORMAL HIGH (ref 12.0–15.0)
Lymphocytes Relative: 17.8 % (ref 12.0–46.0)
Lymphs Abs: 1.5 10*3/uL (ref 0.7–4.0)
MCHC: 32.4 g/dL (ref 30.0–36.0)
MCV: 96.3 fL (ref 78.0–100.0)
Monocytes Absolute: 0.5 10*3/uL (ref 0.1–1.0)
Monocytes Relative: 5.5 % (ref 3.0–12.0)
Neutro Abs: 6.1 10*3/uL (ref 1.4–7.7)
Neutrophils Relative %: 72.5 % (ref 43.0–77.0)
Platelets: 278 10*3/uL (ref 150.0–400.0)
RBC: 5.3 Mil/uL — ABNORMAL HIGH (ref 3.87–5.11)
RDW: 14.2 % (ref 11.5–15.5)
WBC: 8.5 10*3/uL (ref 4.0–10.5)

## 2023-04-14 LAB — COMPREHENSIVE METABOLIC PANEL
ALT: 9 U/L (ref 0–35)
AST: 18 U/L (ref 0–37)
Albumin: 4.6 g/dL (ref 3.5–5.2)
Alkaline Phosphatase: 86 U/L (ref 39–117)
BUN: 19 mg/dL (ref 6–23)
CO2: 20 meq/L (ref 19–32)
Calcium: 9.3 mg/dL (ref 8.4–10.5)
Chloride: 108 meq/L (ref 96–112)
Creatinine, Ser: 0.85 mg/dL (ref 0.40–1.20)
GFR: 78.12 mL/min (ref >60.00)
Glucose, Bld: 88 mg/dL (ref 70–99)
Potassium: 4.4 meq/L (ref 3.5–5.1)
Sodium: 141 meq/L (ref 135–145)
Total Bilirubin: 0.2 mg/dL (ref 0.2–1.2)
Total Protein: 7.4 g/dL (ref 6.0–8.3)

## 2023-04-14 LAB — HIV ANTIBODY (ROUTINE TESTING W REFLEX): HIV 1&2 Ab, 4th Generation: NONREACTIVE

## 2023-04-14 LAB — TSH: TSH: 1.05 u[IU]/mL (ref 0.35–5.50)

## 2023-04-14 LAB — LIPID PANEL
Cholesterol: 200 mg/dL (ref 0–200)
HDL: 60.5 mg/dL (ref >39.00)
LDL Cholesterol: 113 mg/dL — ABNORMAL HIGH (ref 0–99)
NonHDL: 139.28
Total CHOL/HDL Ratio: 3
Triglycerides: 132 mg/dL (ref 0.0–149.0)
VLDL: 26.4 mg/dL (ref 0.0–40.0)

## 2023-04-14 LAB — HEMOGLOBIN A1C: Hgb A1c MFr Bld: 6.4 % (ref 4.6–6.5)

## 2023-04-14 LAB — B12 AND FOLATE PANEL
Folate: 5.9 ng/mL — ABNORMAL LOW (ref >5.9)
Vitamin B-12: 306 pg/mL (ref 211–911)

## 2023-04-14 LAB — HEPATITIS C ANTIBODY: Hepatitis C Ab: NONREACTIVE

## 2023-04-17 NOTE — Addendum Note (Signed)
Addended by: Hyman Hopes B on: 04/17/2023 08:52 AM   Modules accepted: Orders

## 2023-04-18 ENCOUNTER — Inpatient Hospital Stay (HOSPITAL_BASED_OUTPATIENT_CLINIC_OR_DEPARTMENT_OTHER): Admission: RE | Admit: 2023-04-18 | Payer: Medicaid Other | Source: Ambulatory Visit

## 2023-04-25 ENCOUNTER — Ambulatory Visit (INDEPENDENT_AMBULATORY_CARE_PROVIDER_SITE_OTHER): Payer: Medicaid Other | Admitting: Primary Care

## 2023-04-25 ENCOUNTER — Telehealth: Payer: Self-pay | Admitting: Family Medicine

## 2023-04-25 DIAGNOSIS — M5416 Radiculopathy, lumbar region: Secondary | ICD-10-CM

## 2023-04-25 NOTE — Telephone Encounter (Signed)
If she's having new/worsening symptoms we'd need that in an office note. See if she is willing to go to ortho and they can review her last xray and help come up with a plan for further imaging/treatment while waiting to get in with pain management. Ortho usually doesn't take too long to get in with. Also recommend PT if she is willing.

## 2023-04-25 NOTE — Telephone Encounter (Signed)
Patient made aware of recommendations.  She states not having new pain, but pain has been worsening over the last 10 months. She has had to change jobs and cut back working hours due to low back/hip pain. She is willing to see orthopedic and physical therapy if needed. Please place referrals.

## 2023-04-25 NOTE — Addendum Note (Signed)
Addended by: Hyman Hopes B on: 04/25/2023 03:43 PM   Modules accepted: Orders

## 2023-04-25 NOTE — Telephone Encounter (Signed)
Pt called and stated that she received a referral for pain medicine however, when she called the office she was referred to, her referral was still under review. She explained that she has been in a lot of pain for the past 2 weeks and recently had to go to an Urgent Care because she is having pain down her back and hips.   She requested if Ladona Ridgel can send in an order for her to have an MRI done to see if she may have an issue with her nerves. Please call and advise Pt.

## 2023-04-25 NOTE — Addendum Note (Signed)
Addended by: Silvio Pate on: 04/25/2023 03:38 PM   Modules accepted: Orders

## 2023-04-25 NOTE — Telephone Encounter (Signed)
Please advise 

## 2023-04-26 ENCOUNTER — Institutional Professional Consult (permissible substitution) (INDEPENDENT_AMBULATORY_CARE_PROVIDER_SITE_OTHER): Payer: Medicaid Other

## 2023-04-27 ENCOUNTER — Encounter: Payer: Self-pay | Admitting: Physical Medicine and Rehabilitation

## 2023-04-28 ENCOUNTER — Telehealth: Payer: Self-pay | Admitting: Family Medicine

## 2023-04-28 NOTE — Telephone Encounter (Signed)
Chest Xray performed 04/13/2023, please advise.

## 2023-04-28 NOTE — Telephone Encounter (Signed)
Pt states it has been two weeks and she still does not know her imaging results. Please advise if there is anything we can do top expedite this.

## 2023-04-28 NOTE — Telephone Encounter (Signed)
Results available.

## 2023-05-01 ENCOUNTER — Other Ambulatory Visit: Payer: Medicaid Other

## 2023-05-02 ENCOUNTER — Encounter: Payer: Self-pay | Admitting: Physical Medicine and Rehabilitation

## 2023-05-02 ENCOUNTER — Other Ambulatory Visit: Payer: Self-pay | Admitting: Physical Medicine and Rehabilitation

## 2023-05-02 ENCOUNTER — Ambulatory Visit: Payer: Medicaid Other | Admitting: Physical Medicine and Rehabilitation

## 2023-05-02 VITALS — BP 128/82 | HR 71

## 2023-05-02 DIAGNOSIS — M5416 Radiculopathy, lumbar region: Secondary | ICD-10-CM | POA: Diagnosis not present

## 2023-05-02 DIAGNOSIS — G8929 Other chronic pain: Secondary | ICD-10-CM

## 2023-05-02 DIAGNOSIS — G894 Chronic pain syndrome: Secondary | ICD-10-CM | POA: Diagnosis not present

## 2023-05-02 DIAGNOSIS — M5441 Lumbago with sciatica, right side: Secondary | ICD-10-CM | POA: Diagnosis not present

## 2023-05-02 NOTE — Progress Notes (Signed)
Caitlin Pineda - 52 y.o. female MRN [id number]  Date of birth: 1971/02/12  Office Visit Note: Visit Date: 05/02/2023 PCP: Clayborne Dana, NP Referred by: Clayborne Dana, NP  Subjective: Chief Complaint  Patient presents with   Lower Back - Pain   Right Hip - Pain   Right Leg - Pain   HPI: Caitlin Pineda is a 52 y.o. female who comes in today per the request of Hyman Hopes, NP for evaluation of chronic, worsening and severe right sided lower back pain radiating to buttock, hip and anterior thigh. States her pain has increased gradually over the last 6 months. Pain is severe with standing and walking. She describes her pain as sharp and stabbing sensation sensation, currently rates as 10 out of 10 pain. Some relief of pain with home exercise regimen, rest and use of medications. She is scheduled to start regimen of formal physical therapy in the coming weeks. She was previously managed by Merryl Hacker, NP at [facility], however she did not return to this practice. She has an appointment scheduled with Dr. Sula Soda at [facility] on 05/30/2023. Recent lumbar x-rays exhibits mild leftward curvature and multi level disc space narrowing and facet arthropathy. No prior lumbar MRI imaging. Patient is currently working at [employer] in [place]. Patient denies focal weakness, numbness and tingling. No recent trauma or falls.    Oswestry Disability Index Score 36% 10 to 20 (40%) moderate disability: The patient experiences more pain and difficulty with sitting, lifting and standing. Travel and social life are more difficult, and they may be disabled from work. Personal care, sexual activity and sleeping are not grossly affected, and the patient can usually be managed by conservative means.  Review of Systems  Musculoskeletal:  Positive for back pain.  Neurological:  Negative for tingling, sensory change, focal weakness and weakness.  All  other systems reviewed and are negative.  Otherwise per HPI.  Assessment & Plan: Visit Diagnoses:    ICD-10-CM   1. Chronic right-sided low back pain with right-sided sciatica  M54.41    G89.29     2. Lumbar radiculopathy  M54.16     3. Chronic pain syndrome  G89.4        Plan: Findings:  Chronic, worsening and severe right sided lower back pain radiating to buttock, hip and anterior thigh in the setting of chronic pain syndrome. Patient continues to have severe pain despite good conservative therapies such as home exercise regimen, rest and use of medications. Patients clinical presentation and exam are complex, differentials include lumbar radiculopathy vs facet mediated pain. We discussed treatment plan in detail today, next step is to obtain lumbar MRI imaging. Depending on results of lumbar MRI imaging we would consider performing lumbar epidural steroid injection. I encouraged her to continue with formal physical therapy and to keep appointment with Dr. Carlis Abbott to discuss chronic pain management. Patient encouraged to remain active, she can continue to work as tolerated. We will see her back for lumbar MRI review. No red flag symptoms noted upon exam today.     Meds & Orders: No orders of the defined types were placed in this encounter.  No orders of the defined types were placed in this encounter.   Follow-up: Return for lumbar MRI review.   Procedures: No procedures performed      Clinical History: EXAM: XR LUMBAR SPINE 2 OR 3 VIEWS  DATE: 02/19/2023 12:53 PM  ACCESSION: [id number] [facility]  DICTATED: 02/19/2023 1:02 PM  INTERPRETATION LOCATION: Main Campus   CLINICAL INDICATION: 52 years old Female with Back Pain    COMPARISON: None.   TECHNIQUE: AP and lateral views of the lumbar spine.   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 Mild apex leftward curvature of the lumbar spine is centered at approximately L2. There is straightening of lumbar lordosis.  Slight retrolisthesis at L1/L2. Lumbar vertebral body heights are maintained. Multilevel intervertebral disc space narrowing and endplate osteophyte formation are most advanced at L4/L5. Multilevel facet arthropathy. The visualized sacroiliac joints are unremarkable. There are right upper quadrant surgical clips and vascular calcifications.   IMPRESSION:   1. Lumbar scoliosis, multilevel degenerative disc disease and facet osteoarthritis.   2. Transitional anatomy. Please see above regarding numbering.   She reports that she has been smoking cigarettes. She has never used smokeless tobacco.  Recent Labs    04/13/23 1449  HGBA1C 6.4    Objective:  VS:  HT:    WT:   BMI:     BP:128/82  HR:71bpm  TEMP: ( )  RESP:  Physical Exam Vitals and nursing note reviewed.  HENT:     Head: Normocephalic and atraumatic.     Right Ear: External ear normal.     Left Ear: External ear normal.     Nose: Nose normal.     Mouth/Throat:     Mouth: Mucous membranes are moist.  Eyes:     Extraocular Movements: Extraocular movements intact.  Cardiovascular:     Rate and Rhythm: Normal rate.     Pulses: Normal pulses.  Pulmonary:     Effort: Pulmonary effort is normal.  Abdominal:     General: Abdomen is flat. There is no distension.  Musculoskeletal:        General: Tenderness present.     Cervical back: Normal range of motion.     Comments: Patient is slow to rise from seated position to standing. Good lumbar range of motion. No pain noted with facet loading. 5/5 strength noted with bilateral hip flexion, knee flexion/extension, ankle dorsiflexion/plantarflexion and EHL. No clonus noted bilaterally. No pain upon palpation of greater trochanters. No pain with internal/external rotation of bilateral hips. Sensation intact bilaterally. Tenderness noted to right levator scapulae region. Negative slump test bilaterally. Ambulates without aid, gait steady.     Skin:    General: Skin is warm and dry.      Capillary Refill: Capillary refill takes less than 2 seconds.  Neurological:     General: No focal deficit present.     Mental Status: She is alert and oriented to person, place, and time.  Psychiatric:        Mood and Affect: Mood normal.        Behavior: Behavior normal.     Ortho Exam  Imaging: No results found.  Past Medical/Family/Surgical/Social History: Medications & Allergies reviewed per EMR, new medications updated. Patient Active Problem List   Diagnosis Date Noted   Hyperglycemia 04/13/2023   Gastroesophageal reflux disease 04/13/2023   Chronic cough 04/13/2023   Vulva cancer (HCC)    Hypertension    COPD (chronic obstructive pulmonary disease) (HCC)    Disequilibrium 09/25/2018   Tobacco abuse 09/25/2018   Lumbar radiculopathy 02/10/2017   Borderline personality disorder (HCC) 09/03/2015   Major depression, recurrent (HCC) 07/11/2015   Past Medical History:  Diagnosis Date   Borderline personality disorder (HCC) 09/03/2015   COPD (chronic obstructive pulmonary disease) (HCC)  Disequilibrium 09/25/2018   Hypertension    Lumbar radiculopathy 02/10/2017   Formatting of this note might be different from the original. Added automatically from request for surgery [id number]  Formatting of this note might be different from the original. Added automatically from request for surgery [id number]   Major depression, recurrent (HCC) 07/11/2015   Tobacco abuse 09/25/2018   Vulva cancer (HCC)    Family History  Problem Relation Age of Onset   Hypertension Mother    Hypertension Father    Diabetes Father    COPD Father    Gout Father    Past Surgical History:  Procedure Laterality Date   CESAREAN SECTION     laser surgery for vulva cancer     TUBAL LIGATION     Social History   Occupational History   Not on file  Tobacco Use   Smoking status: Every Day    Current packs/day: 1.50    Types: Cigarettes   Smokeless tobacco: Never  Substance and Sexual Activity    Alcohol use: Yes    Comment: 1 pint liquor every afternoon   Drug use: Yes    Types: Cocaine   Sexual activity: Not on file

## 2023-05-02 NOTE — Progress Notes (Signed)
Functional Pain Scale - descriptive words and definitions  Immobilizing (10)   Unable to move or talk due to intensity of pain/unable to sleep and unable to use distraction. Severe range order  Average Pain 10  Hx of DDD, injury to back 6 months ago and tweaked back. Has pain that radiates to right hip and leg. Heating pad helps.

## 2023-05-05 ENCOUNTER — Ambulatory Visit (INDEPENDENT_AMBULATORY_CARE_PROVIDER_SITE_OTHER): Payer: Medicaid Other | Admitting: Otolaryngology

## 2023-05-05 ENCOUNTER — Other Ambulatory Visit (INDEPENDENT_AMBULATORY_CARE_PROVIDER_SITE_OTHER): Payer: Medicaid Other

## 2023-05-05 ENCOUNTER — Encounter (INDEPENDENT_AMBULATORY_CARE_PROVIDER_SITE_OTHER): Payer: Self-pay

## 2023-05-05 VITALS — Ht 64.0 in | Wt 125.0 lb

## 2023-05-05 DIAGNOSIS — E538 Deficiency of other specified B group vitamins: Secondary | ICD-10-CM

## 2023-05-05 DIAGNOSIS — D582 Other hemoglobinopathies: Secondary | ICD-10-CM | POA: Diagnosis not present

## 2023-05-05 DIAGNOSIS — J449 Chronic obstructive pulmonary disease, unspecified: Secondary | ICD-10-CM | POA: Diagnosis not present

## 2023-05-05 DIAGNOSIS — F17200 Nicotine dependence, unspecified, uncomplicated: Secondary | ICD-10-CM

## 2023-05-05 DIAGNOSIS — B3789 Other sites of candidiasis: Secondary | ICD-10-CM

## 2023-05-05 DIAGNOSIS — F17210 Nicotine dependence, cigarettes, uncomplicated: Secondary | ICD-10-CM | POA: Diagnosis not present

## 2023-05-05 DIAGNOSIS — R1319 Other dysphagia: Secondary | ICD-10-CM | POA: Diagnosis not present

## 2023-05-05 DIAGNOSIS — R1310 Dysphagia, unspecified: Secondary | ICD-10-CM

## 2023-05-05 DIAGNOSIS — K1370 Unspecified lesions of oral mucosa: Secondary | ICD-10-CM

## 2023-05-05 MED ORDER — FLUCONAZOLE 100 MG PO TABS: 100.0000 mg | ORAL_TABLET | Freq: Every day | ORAL | 0 refills | Status: DC

## 2023-05-05 NOTE — Addendum Note (Signed)
Addended by: Mervin Kung A on: 05/05/2023 08:49 AM   Modules accepted: Orders

## 2023-05-05 NOTE — Patient Instructions (Signed)
I have ordered an swallow study for you to complete prior to your next visit. Please call [facility] Scheduling at [phone] to schedule your imaging if you have not received a call within 24 hours. If you are unable to complete your imaging study prior to your next scheduled visit please call our office to let us know.

## 2023-05-05 NOTE — Progress Notes (Signed)
Dear Dr. Reola Calkins, Here is my assessment for our mutual patient, Caitlin Pineda. Thank you for allowing me the opportunity to care for your patient. Please do not hesitate to contact me should you have any other questions. Sincerely, Dr. Jovita Kussmaul  Otolaryngology Clinic Note Referring provider: Dr. Reola Calkins HPI:  Caitlin Pineda is a 52 y.o. female kindly referred by Dr. Reola Calkins for evaluation of chronic cough and oral lesions.  Started about 4 months ago, first with a dry cough. Throat pain, some odynophagia with food, and dysphagia. Problem with solid foods. Has to drink lots of water. No regurgitation. No significant ear pain, no hemoptysis. No aspiration episodes or Heimlich. She also reports "white spot" on roof of her mouth and on gums. No unintentional weight loss, no neck masses Has not tried any mouthwashes or other medications. She is worried about cancer and wants to rule that out.  No prior swallow eval  She smokes 1.5 PPD for 40 years. She is on Trelegy and albuterol.  GERD on protonix - in morning before breakfast (20mg )  PMHx: COPD, Degenerative disc disease, Arthritis, Bipolar  H&N Surgery: denies Personal or FHx of bleeding dz or anesthesia difficulty: no  She has been referred to pulm and is trying to get in with them.  Independent Review of Additional Tests or Records:  PCP notes reviewed CXR: no consolidation TSH, and labs recently reviewed: HIV neg, low folate, normal B12  PMH/Meds/All/SocHx/FamHx/ROS:   Past Medical History:  Diagnosis Date   Borderline personality disorder (HCC) 09/03/2015   COPD (chronic obstructive pulmonary disease) (HCC)    Disequilibrium 09/25/2018   Hypertension    Lumbar radiculopathy 02/10/2017   Formatting of this note might be different from the original. Added automatically from request for surgery [id number]  Formatting of this note might be different from the original. Added automatically from request for surgery [id number]   Major  depression, recurrent (HCC) 07/11/2015   Tobacco abuse 09/25/2018   Vulva cancer (HCC)      Past Surgical History:  Procedure Laterality Date   CESAREAN SECTION     laser surgery for vulva cancer     TUBAL LIGATION      Family History  Problem Relation Age of Onset   Hypertension Mother    Hypertension Father    Diabetes Father    COPD Father    Gout Father      Social Connections: Unknown (11/22/2021)   Received from [facility]   Social Network    Social Network: Not on file      Current Outpatient Medications:    albuterol (VENTOLIN HFA) 108 (90 Base) MCG/ACT inhaler, Inhale 2 puffs into the lungs every 6 (six) hours as needed for wheezing or shortness of breath., Disp: 18 g, Rfl: 5   Fluticasone-Umeclidin-Vilant (TRELEGY ELLIPTA) 100-62.5-25 MCG/ACT AEPB, Inhale 1 puff into the lungs daily., Disp: 1 each, Rfl: 11   pantoprazole (PROTONIX) 20 MG tablet, Take 1 tablet (20 mg total) by mouth daily., Disp: 90 tablet, Rfl: 3   BUTRANS 15 MCG/HR, 1 patch once a week. (Patient not taking: Reported on 04/13/2023), Disp: , Rfl:    HYDROcodone-acetaminophen (NORCO/VICODIN) 5-325 MG tablet, TK 1-2 TS PO Q 4-6 H PRN P (Patient not taking: Reported on 04/13/2023), Disp: , Rfl:    Physical Exam:   Ht 5\' 4"  (1.626 m)   Wt 125 lb (56.7 kg)   BMI 21.46 kg/m    Salient findings:  CN II-XII intact  Bilateral EAC  clear and TM intact with well pneumatized middle ear spaces Anterior rhinoscopy: Septum relatively midline; bilateral inferior turbinates without significant hypertrophy No lesions of oral cavity/oropharynx - see photo below. I asked her to show the spot on the palate and gums, which she reports is present - there are no mucosal abnormalities there No obviously palpable neck masses/lymphadenopathy/thyromegaly No respiratory distress or stridor; vocal quality raspy - class 2.5  Procedures:  Procedure Note Pre-procedure diagnosis:  Dysphagia,  odynophagia Post-procedure diagnosis: Same Procedure: Transnasal Fiberoptic Laryngoscopy, CPT 95621 - Mod 25 Indication: dysphagia, odynophagia Complications: None apparent EBL: 0 mL Date: 05/05/23   The procedure was undertaken to further evaluate the patient's complaint of dysphagia, odynophagia, with mirror exam inadequate for appropriate examination due to gag reflex and poor patient tolerance  Procedure:  Patient was identified as correct patient. Verbal consent was obtained. The nose was sprayed with oxymetazoline and 4% lidocaine. The The flexible laryngoscope was passed through the nose to view the nasal cavity, pharynx (oropharynx, hypopharynx) and larynx.  The larynx was examined at rest and during multiple phonatory tasks. Documentation was obtained and reviewed with patient. The scope was removed. The patient tolerated the procedure well.  Findings: The nasal cavity and nasopharynx did not reveal any masses or lesions, mucosa appeared to be without obvious lesions. The tongue base, pharyngeal walls, piriform sinuses, vallecula, epiglottis and postcricoid region are normal in appearance without any obvious mucosal lesions. The visualized portion of the subglottis and proximal trachea is widely patent. The vocal folds are mobile bilaterally. There are no lesions on the free edge of the vocal folds nor elsewhere in the larynx worrisome for malignancy.      No lesions    Electronically signed by: Read Drivers, MD 05/05/2023 2:57 PM   Impression & Plans:  Caitlin Pineda is a 52 y.o. female with likely COPD and extensive smoking history: Dysphagia to solids, odynophagia - Unclear what is precipitating this - could be a candidal infection v/s other primary GI etiology. Dysphagia Ddx is quite wide and inclues pharyngoesophageal dysphagia v/s esophageal etiology - I do not see any masses that would be worrisome today, though cannot assess esophagus - She may also be having LPR and  some referred discomfort - Empiric treatment for candidiasis with 100mg  daily fluconazole - We will therefore increase her protonix to 40mg  nightly and obtain an MBS. Should she still have trouble, will refer to GI - Advised her to stop smoking  2. Oral lesions: I reassured her that I do not see any mucosal abnormality.  - Advised to stop smoking  - f/u 6-8 weeks with MBS  MDM:  Level 4: 99204 Complexity/Problems addressed: mod - unk prognosis, further testing ordreed Data complexity: mod - Morbidity: mod  - Prescription Drug prescribed or managed: yes     Thank you for allowing me the opportunity to care for your patient. Please do not hesitate to contact me should you have any other questions.  Sincerely, Jovita Kussmaul, MD Otolarynoglogist (ENT), [facility] Phone: [phone] Fax: [phone]  05/05/2023, 2:29 PM

## 2023-05-09 ENCOUNTER — Other Ambulatory Visit (HOSPITAL_COMMUNITY): Payer: Self-pay | Admitting: *Deleted

## 2023-05-09 ENCOUNTER — Telehealth: Payer: Self-pay | Admitting: Family Medicine

## 2023-05-09 ENCOUNTER — Telehealth: Payer: Self-pay | Admitting: Physical Medicine and Rehabilitation

## 2023-05-09 DIAGNOSIS — R1310 Dysphagia, unspecified: Secondary | ICD-10-CM

## 2023-05-09 LAB — CBC WITH DIFFERENTIAL/PLATELET
Absolute Lymphocytes: 1298 {cells}/uL (ref 850–3900)
Absolute Monocytes: 284 {cells}/uL (ref 200–950)
Basophils Absolute: 32 {cells}/uL (ref 0–200)
Basophils Relative: 0.5 %
Eosinophils Absolute: 183 {cells}/uL (ref 15–500)
Eosinophils Relative: 2.9 %
HCT: 46.9 % — ABNORMAL HIGH (ref 35.0–45.0)
Hemoglobin: 15.1 g/dL (ref 11.7–15.5)
MCH: 31.1 pg (ref 27.0–33.0)
MCHC: 32.2 g/dL (ref 32.0–36.0)
MCV: 96.5 fL (ref 80.0–100.0)
MPV: 9.9 fL (ref 7.5–12.5)
Monocytes Relative: 4.5 %
Neutro Abs: 4505 {cells}/uL (ref 1500–7800)
Neutrophils Relative %: 71.5 %
Platelets: 252 10*3/uL (ref 140–400)
RBC: 4.86 10*6/uL (ref 3.80–5.10)
RDW: 12.3 % (ref 11.0–15.0)
Total Lymphocyte: 20.6 %
WBC: 6.3 10*3/uL (ref 3.8–10.8)

## 2023-05-09 LAB — HOMOCYSTEINE: Homocysteine: 13.4 umol/L — ABNORMAL HIGH (ref <10.4)

## 2023-05-09 LAB — METHYLMALONIC ACID, SERUM: Methylmalonic Acid, Quant: 146 nmol/L (ref 55–335)

## 2023-05-09 LAB — PATHOLOGIST SMEAR REVIEW

## 2023-05-09 LAB — ERYTHROPOIETIN: Erythropoietin: 16.4 m[IU]/mL (ref 2.6–18.5)

## 2023-05-09 NOTE — Telephone Encounter (Signed)
Patient was referred to Ortho to manage until she sees pain management. [facility] note in epic. Please advise.

## 2023-05-09 NOTE — Telephone Encounter (Signed)
Yes, ortho is following. She needs to reach out to them.

## 2023-05-09 NOTE — Telephone Encounter (Signed)
Patient called and said that is it possible for her to get any pain medication? She been in so much pain that it caused her to lose her job. CB#[phone]

## 2023-05-09 NOTE — Telephone Encounter (Signed)
Pt called stating that her pain has gotten worse to the point where her previous job had let her go. Pt stated that she has something else lined up but needs to see about getting her pain under control so she can go back to work. Pain management can't see her for some time at this point and she is wondering what to do.

## 2023-05-09 NOTE — Telephone Encounter (Signed)
Called patient and made her aware. She states Ortho told her at her appointment they don't prescribe pain medication. I did ask her to call and speak to them about this. She has an MRI scheduled next Tuesday. She got fired cause she can not work due to pain.

## 2023-05-12 ENCOUNTER — Ambulatory Visit: Payer: Medicaid Other | Attending: Family Medicine

## 2023-05-12 NOTE — Therapy (Deleted)
OUTPATIENT PHYSICAL THERAPY THORACOLUMBAR EVALUATION   Patient Name: Caitlin Pineda MRN: [id number] DOB:[birth year], 52 y.o., female Today's Date: 05/12/2023  END OF SESSION:   Past Medical History:  Diagnosis Date   Borderline personality disorder (HCC) 09/03/2015   COPD (chronic obstructive pulmonary disease) (HCC)    Disequilibrium 09/25/2018   Hypertension    Lumbar radiculopathy 02/10/2017   Formatting of this note might be different from the original. Added automatically from request for surgery [id number]  Formatting of this note might be different from the original. Added automatically from request for surgery [id number]   Major depression, recurrent (HCC) 07/11/2015   Tobacco abuse 09/25/2018   Vulva cancer [facility])    Past Surgical History:  Procedure Laterality Date   CESAREAN SECTION     laser surgery for vulva cancer     TUBAL LIGATION     Patient Active Problem List   Diagnosis Date Noted   Hyperglycemia 04/13/2023   Gastroesophageal reflux disease 04/13/2023   Chronic cough 04/13/2023   Vulva cancer (HCC)    Hypertension    COPD (chronic obstructive pulmonary disease) (HCC)    Disequilibrium 09/25/2018   Tobacco abuse 09/25/2018   Lumbar radiculopathy 02/10/2017   Borderline personality disorder (HCC) 09/03/2015   Major depression, recurrent (HCC) 07/11/2015    PCP: ***  REFERRING PROVIDER: ***  REFERRING DIAG: ***  Rationale for Evaluation and Treatment: Rehabilitation  THERAPY DIAG:  No diagnosis found.  ONSET DATE: ***  SUBJECTIVE:                                                                                                                                                                                           SUBJECTIVE STATEMENT: ***  PERTINENT HISTORY:  ***  PAIN:  Are you having pain? {OPRCPAIN:27236}  PRECAUTIONS: {Therapy precautions:24002}  RED FLAGS: {PT Red Flags:29287}   WEIGHT BEARING RESTRICTIONS: {Yes  ***/No:24003}  FALLS:  Has patient fallen in last 6 months? {fallsyesno:27318}  LIVING ENVIRONMENT: Lives with: {OPRC lives with:25569::"lives with their family"} Lives in: {Lives in:25570} Stairs: {opstairs:27293} Has following equipment at home: {Assistive devices:23999}  OCCUPATION: ***  PLOF: {PLOF:24004}  PATIENT GOALS: ***  NEXT MD VISIT: ***  OBJECTIVE:  Note: Objective measures were completed at Evaluation unless otherwise noted.  DIAGNOSTIC FINDINGS:  ***  PATIENT SURVEYS:  {rehab surveys:24030}  SCREENING FOR RED FLAGS: Bowel or bladder incontinence: {Yes/No:304960894} Spinal tumors: {Yes/No:304960894} Cauda equina syndrome: {Yes/No:304960894} Compression fracture: {Yes/No:304960894} Abdominal aneurysm: {Yes/No:304960894}  COGNITION: Overall cognitive status: {cognition:24006}     SENSATION: {sensation:27233}  MUSCLE LENGTH: Hamstrings: Right *** deg; Left *** deg Thomas test: Right *** deg; Left *** deg  POSTURE: {posture:25561}  PALPATION: ***  LUMBAR ROM:   AROM eval  Flexion   Extension   Right lateral flexion   Left lateral flexion   Right rotation   Left rotation    (Blank rows = not tested)  LOWER EXTREMITY ROM:     {AROM/PROM:27142}  Right eval Left eval  Hip flexion    Hip extension    Hip abduction    Hip adduction    Hip internal rotation    Hip external rotation    Knee flexion    Knee extension    Ankle dorsiflexion    Ankle plantarflexion    Ankle inversion    Ankle eversion     (Blank rows = not tested)  LOWER EXTREMITY MMT:    MMT Right eval Left eval  Hip flexion    Hip extension    Hip abduction    Hip adduction    Hip internal rotation    Hip external rotation    Knee flexion    Knee extension    Ankle dorsiflexion    Ankle plantarflexion    Ankle inversion    Ankle eversion     (Blank rows = not tested)  LUMBAR SPECIAL TESTS:  {lumbar special test:25242}  FUNCTIONAL TESTS:  {Functional  tests:24029}  GAIT: Distance walked: *** Assistive device utilized: {Assistive devices:23999} Level of assistance: {Levels of assistance:24026} Comments: ***  TODAY'S TREATMENT:                                                                                                                              DATE: ***    PATIENT EDUCATION:  Education details: *** Person educated: {Person educated:25204} Education method: {Education Method:25205} Education comprehension: {Education Comprehension:25206}  HOME EXERCISE PROGRAM: ***  ASSESSMENT:  CLINICAL IMPRESSION: Patient is a *** y.o. *** who was seen today for physical therapy evaluation and treatment for ***.   OBJECTIVE IMPAIRMENTS: {opptimpairments:25111}.   ACTIVITY LIMITATIONS: {activitylimitations:27494}  PARTICIPATION LIMITATIONS: {participationrestrictions:25113}  PERSONAL FACTORS: {Personal factors:25162} are also affecting patient's functional outcome.   REHAB POTENTIAL: {rehabpotential:25112}  CLINICAL DECISION MAKING: {clinical decision making:25114}  EVALUATION COMPLEXITY: {Evaluation complexity:25115}   GOALS: Goals reviewed with patient? {yes/no:20286}  SHORT TERM GOALS: Target date: ***  *** Baseline: Goal status: INITIAL  2.  *** Baseline:  Goal status: INITIAL  3.  *** Baseline:  Goal status: INITIAL  4.  *** Baseline:  Goal status: INITIAL  5.  *** Baseline:  Goal status: INITIAL  6.  *** Baseline:  Goal status: INITIAL  LONG TERM GOALS: Target date: ***  *** Baseline:  Goal status: INITIAL  2.  *** Baseline:  Goal status: INITIAL  3.  *** Baseline:  Goal status: INITIAL  4.  *** Baseline:  Goal status: INITIAL  5.  *** Baseline:  Goal status: INITIAL  6.  *** Baseline:  Goal status: INITIAL  PLAN:  PT FREQUENCY: {rehab frequency:25116}  PT DURATION: {rehab duration:25117}  PLANNED  INTERVENTIONS: {rehab planned interventions:25118::"97110-Therapeutic  exercises","97530- Therapeutic [phone]- Neuromuscular re-education","97535- Self JXBJ","47829- Manual therapy"}.  PLAN FOR NEXT SESSION: Mauri Reading, PT 05/12/2023, 9:01 AM

## 2023-05-15 ENCOUNTER — Telehealth: Payer: Self-pay | Admitting: Family Medicine

## 2023-05-15 NOTE — Telephone Encounter (Signed)
Pt sates she called ortho a couple days ago about pain medication and has not herd anything back. She stated she was told to reach out to [place] to see what to do in the meantime.

## 2023-05-16 ENCOUNTER — Other Ambulatory Visit: Payer: Medicaid Other

## 2023-05-16 ENCOUNTER — Other Ambulatory Visit: Payer: Self-pay | Admitting: Physical Medicine and Rehabilitation

## 2023-05-16 ENCOUNTER — Telehealth: Payer: Self-pay | Admitting: Physical Medicine and Rehabilitation

## 2023-05-16 MED ORDER — METHOCARBAMOL 500 MG PO TABS: 500.0000 mg | ORAL_TABLET | Freq: Three times a day (TID) | ORAL | 0 refills | Status: AC

## 2023-05-16 MED ORDER — HYDROCODONE-ACETAMINOPHEN 5-325 MG PO TABS: 1.0000 | ORAL_TABLET | Freq: Three times a day (TID) | ORAL | 0 refills | Status: AC | PRN

## 2023-05-16 NOTE — Telephone Encounter (Signed)
Patient contacted by Ortho.

## 2023-05-16 NOTE — Telephone Encounter (Signed)
Pt called requesting muscle relaxer and pan meds until she ca get to pain management. Pt has to go back to work and need something to help with pain Please send to [facility]. Pt phon e number is [phone]. Please call pt when sent in

## 2023-05-21 ENCOUNTER — Other Ambulatory Visit: Payer: Medicaid Other

## 2023-05-23 ENCOUNTER — Ambulatory Visit (HOSPITAL_COMMUNITY)
Admission: RE | Admit: 2023-05-23 | Discharge: 2023-05-23 | Disposition: A | Discharge status: Home / Self Care | Payer: Medicaid Other | Source: Ambulatory Visit | Attending: *Deleted | Admitting: *Deleted

## 2023-05-23 ENCOUNTER — Ambulatory Visit (HOSPITAL_COMMUNITY)
Admission: RE | Admit: 2023-05-23 | Discharge: 2023-05-23 | Disposition: A | Discharge status: Home / Self Care | Payer: Medicaid Other | Source: Ambulatory Visit | Attending: Otolaryngology | Admitting: Otolaryngology

## 2023-05-23 DIAGNOSIS — K219 Gastro-esophageal reflux disease without esophagitis: Secondary | ICD-10-CM | POA: Diagnosis not present

## 2023-05-23 DIAGNOSIS — R1310 Dysphagia, unspecified: Secondary | ICD-10-CM | POA: Diagnosis present

## 2023-05-23 DIAGNOSIS — Z8544 Personal history of malignant neoplasm of other female genital organs: Secondary | ICD-10-CM | POA: Insufficient documentation

## 2023-05-23 DIAGNOSIS — Z87891 Personal history of nicotine dependence: Secondary | ICD-10-CM | POA: Diagnosis not present

## 2023-05-23 DIAGNOSIS — J449 Chronic obstructive pulmonary disease, unspecified: Secondary | ICD-10-CM | POA: Insufficient documentation

## 2023-05-23 NOTE — Progress Notes (Signed)
Modified Barium Swallow Study  Patient Details  Name: Caitlin Pineda MRN: [id number] Date of Birth: 28-Aug-1970  Today's Date: 05/23/2023  Modified Barium Swallow completed.  Full report located under Chart Review in the Imaging Section.  History of Present Illness Caitlin Pineda is a 52 yo female presenting for OP MBS. Pt reports history of esophageal reflux, which is poorly managed with medication. She states that she has increased difficutly swallowing meats and pills and frequently experiences a globus sensation. Recently seen by ENT 10/25 for evaluation of chronic cough and oral lesions. Per note, pt additionally reports dry cough, odynophagia, and dysphagia with solids. No lesions noted during ENT scope, although notes possibility of LPR or esophageal dysphagia. PMH includes 40 year history of tobacco use, COPD, degenerative disc disease, arthritis, bipolar   Clinical Impression Pt presents with an overall functional oropharyngeal swallow. 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 Her strength appears [facility] and she seemingly compensates well for these structural differences. PES opening is characterized by partial duration and distension. The barium tablet was administered with thin liquids. Retention was noted below the PES requiring a subsequent sip of thin liquids to clear, although note it traveled slowly through the distal esophagus. Provided education re: esophageal precautions and recommend f/u for further assessment of the esophagus with GI provider. No SLP f/u is warranted at this time. Factors that may increase risk of adverse event in presence of aspiration Rubye Oaks & Clearance Coots 2021):    Swallow Evaluation Recommendations Recommendations: PO diet PO Diet Recommendation: Regular;Thin liquids (Level 0) Liquid Administration via:  Cup;Straw Medication Administration: Whole meds with puree Supervision: Patient able to self-feed Swallowing strategies  : Slow rate;Small bites/sips Postural changes: Position pt fully upright for meals;Stay upright 30-60 min after meals Oral care recommendations: Oral care BID (2x/day) Recommended consults: Consider GI consultation;Consider esophageal assessment      Gwynneth Aliment, M.A., CF-SLP Speech Language Pathology, Acute Rehabilitation Services  Secure Chat preferred [phone]  05/23/2023,12:19 PM
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
## 2023-05-26 ENCOUNTER — Ambulatory Visit (HOSPITAL_COMMUNITY): Payer: Medicaid Other | Admitting: Student

## 2023-05-30 ENCOUNTER — Encounter: Payer: Self-pay | Admitting: *Deleted

## 2023-05-30 ENCOUNTER — Encounter: Payer: Medicaid Other | Admitting: Physical Medicine and Rehabilitation

## 2023-06-15 ENCOUNTER — Encounter: Payer: Self-pay | Admitting: Physical Medicine and Rehabilitation

## 2023-06-16 ENCOUNTER — Ambulatory Visit (INDEPENDENT_AMBULATORY_CARE_PROVIDER_SITE_OTHER): Payer: Medicaid Other

## 2023-06-16 NOTE — Progress Notes (Unsigned)
Dear Dr. Reola Calkins, Here is my assessment for our mutual patient, Caitlin Pineda. Thank you for allowing me the opportunity to care for your patient. Please do not hesitate to contact me should you have any other questions. Sincerely, Dr. Jovita Kussmaul  Otolaryngology Clinic Note Referring provider: Dr. Reola Calkins HPI:  Caitlin Pineda is a 52 y.o. female kindly referred by Dr. Reola Calkins for evaluation of chronic cough and oral lesions.  Started about 4 months ago, first with a dry cough. Throat pain, some odynophagia with food, and dysphagia. Problem with solid foods. Has to drink lots of water. No regurgitation. No significant ear pain, no hemoptysis. No aspiration episodes or Heimlich. She also reports "white spot" on roof of her mouth and on gums. No unintentional weight loss, no neck masses Has not tried any mouthwashes or other medications. She is worried about cancer and wants to rule that out.  No prior swallow eval  She smokes 1.5 PPD for 40 years. She is on Trelegy and albuterol.  GERD on protonix - in morning before breakfast (20mg )  ------------------------------------- Given her smoking history and to rule out carcinoma and eval for dysphagia, TFL was performed. No lesions noted. We also dicsussed MBS for dysphagia and she proceeded with it, performed it, and now returns for follow up.  Today (06/16/2023):    -----------------------------------------------------------  PMHx: COPD, Degenerative disc disease, Arthritis, Bipolar  H&N Surgery: denies Personal or FHx of bleeding dz or anesthesia difficulty: no Tobacco: 1.5 PPD  Independent Review of Additional Tests or Records:  PCP notes reviewed CXR (04/13/2023): no consolidation or large nodules TSH, and labs (05/05/2023): HIV neg, low folate, normal B12  MBS 05/23/2023 independently interpreted, agree with read: no aspiration, does have CP bar; no significant dysmotility or distal stricture noted    PMH/Meds/All/SocHx/FamHx/ROS:    Past Medical History:  Diagnosis Date   Borderline personality disorder (HCC) 09/03/2015   COPD (chronic obstructive pulmonary disease) (HCC)    Disequilibrium 09/25/2018   Hypertension    Lumbar radiculopathy 02/10/2017   Formatting of this note might be different from the original. Added automatically from request for surgery [id number]  Formatting of this note might be different from the original. Added automatically from request for surgery [id number]   Major depression, recurrent (HCC) 07/11/2015   Tobacco abuse 09/25/2018   Vulva cancer (HCC)      Past Surgical History:  Procedure Laterality Date   CESAREAN SECTION     laser surgery for vulva cancer     TUBAL LIGATION      Family History  Problem Relation Age of Onset   Hypertension Mother    Hypertension Father    Diabetes Father    COPD Father    Gout Father      Social Connections: Unknown (11/22/2021)   Received from [facility]   Social Network    Social Network: Not on file      Current Outpatient Medications:    albuterol (VENTOLIN HFA) 108 (90 Base) MCG/ACT inhaler, Inhale 2 puffs into the lungs every 6 (six) hours as needed for wheezing or shortness of breath., Disp: 18 g, Rfl: 5   BUTRANS 15 MCG/HR, 1 patch once a week. (Patient not taking: Reported on 04/13/2023), Disp: , Rfl:    fluconazole (DIFLUCAN) 100 MG tablet, Take 1 tablet (100 mg total) by mouth daily., Disp: 14 tablet, Rfl: 0   Fluticasone-Umeclidin-Vilant (TRELEGY ELLIPTA) 100-62.5-25 MCG/ACT AEPB, Inhale 1 puff into the lungs daily., Disp: 1 each, Rfl: 11  methocarbamol (ROBAXIN) 500 MG tablet, Take 1 tablet (500 mg total) by mouth 3 (three) times daily., Disp: 90 tablet, Rfl: 0   pantoprazole (PROTONIX) 20 MG tablet, Take 1 tablet (20 mg total) by mouth daily., Disp: 90 tablet, Rfl: 3   Physical Exam:   There were no vitals taken for this visit.   Salient findings:  CN II-XII intact  Bilateral EAC clear and TM intact with well  pneumatized middle ear spaces Anterior rhinoscopy: Septum relatively midline; bilateral inferior turbinates without significant hypertrophy No lesions of oral cavity/oropharynx - see photo below. I asked her to show the spot on the palate and gums, which she reports is present - there are no mucosal abnormalities there No obviously palpable neck masses/lymphadenopathy/thyromegaly No respiratory distress or stridor; vocal quality raspy - class 2.5  Procedures:  Procedure Note Pre-procedure diagnosis:  Dysphagia, odynophagia Post-procedure diagnosis: Same Procedure: Transnasal Fiberoptic Laryngoscopy, CPT 16109 - Mod 25 Indication: dysphagia, odynophagia Complications: None apparent EBL: 0 mL Date: 06/16/23   The procedure was undertaken to further evaluate the patient's complaint of dysphagia, odynophagia, with mirror exam inadequate for appropriate examination due to gag reflex and poor patient tolerance  Procedure:  Patient was identified as correct patient. Verbal consent was obtained. The nose was sprayed with oxymetazoline and 4% lidocaine. The The flexible laryngoscope was passed through the nose to view the nasal cavity, pharynx (oropharynx, hypopharynx) and larynx.  The larynx was examined at rest and during multiple phonatory tasks. Documentation was obtained and reviewed with patient. The scope was removed. The patient tolerated the procedure well.  Findings: The nasal cavity and nasopharynx did not reveal any masses or lesions, mucosa appeared to be without obvious lesions. The tongue base, pharyngeal walls, piriform sinuses, vallecula, epiglottis and postcricoid region are normal in appearance without any obvious mucosal lesions. The visualized portion of the subglottis and proximal trachea is widely patent. The vocal folds are mobile bilaterally. There are no lesions on the free edge of the vocal folds nor elsewhere in the larynx worrisome for malignancy.      No  lesions    Electronically signed by: Read Drivers, MD 06/16/2023 12:01 PM   Impression & Plans:  Caitlin Pineda is a 52 y.o. female with likely COPD and extensive smoking history: Dysphagia to solids, odynophagia - Unclear what is precipitating this - could be a candidal infection v/s other primary GI etiology. Dysphagia Ddx is quite wide and inclues pharyngoesophageal dysphagia v/s esophageal etiology - I do not see any masses that would be worrisome today, though cannot assess esophagus - She may also be having LPR and some referred discomfort - Empiric treatment for candidiasis with 100mg  daily fluconazole - We will therefore increase her protonix to 40mg  nightly and obtain an MBS. Should she still have trouble, will refer to GI - Advised her to stop smoking  2. Oral lesions: I reassured her that I do not see any mucosal abnormality.  - Advised to stop smoking  - f/u 6-8 weeks with MBS  MDM:  Level 4: 99204 Complexity/Problems addressed: mod - unk prognosis, further testing ordreed Data complexity: mod - Morbidity: mod  - Prescription Drug prescribed or managed: yes     Thank you for allowing me the opportunity to care for your patient. Please do not hesitate to contact me should you have any other questions.  Sincerely, Jovita Kussmaul, MD Otolarynoglogist (ENT), [facility] ENT Specialist Phone: [phone] Fax: [phone]  06/16/2023, 12:01 PM

## 2023-06-18 ENCOUNTER — Ambulatory Visit
Admission: RE | Admit: 2023-06-18 | Discharge: 2023-06-18 | Disposition: A | Discharge status: Home / Self Care | Payer: Medicaid Other | Source: Ambulatory Visit | Attending: Physical Medicine and Rehabilitation | Admitting: Physical Medicine and Rehabilitation

## 2023-06-18 DIAGNOSIS — G894 Chronic pain syndrome: Secondary | ICD-10-CM

## 2023-06-18 DIAGNOSIS — M5416 Radiculopathy, lumbar region: Secondary | ICD-10-CM

## 2023-06-18 DIAGNOSIS — G8929 Other chronic pain: Secondary | ICD-10-CM

## 2023-07-03 ENCOUNTER — Other Ambulatory Visit: Payer: Self-pay | Admitting: Physical Medicine and Rehabilitation

## 2023-07-03 ENCOUNTER — Telehealth: Payer: Self-pay | Admitting: Physical Medicine and Rehabilitation

## 2023-07-03 ENCOUNTER — Encounter: Payer: Self-pay | Admitting: Family Medicine

## 2023-07-03 DIAGNOSIS — M545 Low back pain, unspecified: Secondary | ICD-10-CM

## 2023-07-03 DIAGNOSIS — M47816 Spondylosis without myelopathy or radiculopathy, lumbar region: Secondary | ICD-10-CM

## 2023-07-03 DIAGNOSIS — M47819 Spondylosis without myelopathy or radiculopathy, site unspecified: Secondary | ICD-10-CM

## 2023-07-03 MED ORDER — HYDROCODONE-ACETAMINOPHEN 5-325 MG PO TABS: 1.0000 | ORAL_TABLET | Freq: Three times a day (TID) | ORAL | 0 refills | Status: AC | PRN

## 2023-07-03 MED ORDER — DIAZEPAM 5 MG PO TABS: ORAL_TABLET | ORAL | 0 refills | Status: DC

## 2023-07-03 NOTE — Telephone Encounter (Signed)
Pt called requesting a refill of pain medication prescribed by PA Mayford Knife pt states her pain management appt is not until 1/28. Is her pain management appt. Pt states pease send in medication for relief until then. Please send to [facility] in [place] Caitlin Pineda. Please call pt when sent in at  [phone].

## 2023-07-10 ENCOUNTER — Ambulatory Visit: Payer: Medicaid Other | Admitting: Physical Medicine and Rehabilitation

## 2023-07-11 ENCOUNTER — Ambulatory Visit: Payer: Medicaid Other | Admitting: Medical

## 2023-07-14 ENCOUNTER — Telehealth: Payer: Self-pay

## 2023-07-14 ENCOUNTER — Other Ambulatory Visit: Payer: Self-pay | Admitting: Family Medicine

## 2023-07-14 DIAGNOSIS — J449 Chronic obstructive pulmonary disease, unspecified: Secondary | ICD-10-CM

## 2023-07-14 MED ORDER — TRELEGY ELLIPTA 100-62.5-25 MCG/ACT IN AEPB: 1.0000 | INHALATION_SPRAY | Freq: Every day | RESPIRATORY_TRACT | 1 refills | Status: DC

## 2023-07-14 NOTE — Telephone Encounter (Signed)
 Copied from CRM [id number]. Topic: Clinical - Medication Refill >> Jul 14, 2023  2:26 PM Isabell A wrote: Most Recent Primary Care Visit:  Provider: [facility]  [facility]  Visit Type: LAB  Date: 05/05/2023  Medication: Fluticasone-Umeclidin-Vilant (TRELEGY ELLIPTA ) 100-62.5-25 MCG/ACT AEPB  Has the patient contacted their pharmacy? No (Agent: If no, request that the patient contact the pharmacy for the refill. If patient does not wish to contact the pharmacy document the reason why and proceed with request.) (Agent: If yes, when and what did the pharmacy advise?) Patient states there was no additional refills.  Is this the correct pharmacy for this prescription? Yes If no, delete pharmacy and type the correct one.  This is the patient's preferred pharmacy:   [facility] [address] [place] - [address] [facility] [place] [address] Phone: [phone] Fax: [phone]   Has the prescription been filled recently? Yes  Is the patient out of the medication? Yes  Has the patient been seen for an appointment in the last year OR does the patient have an upcoming appointment? Yes  Can we respond through MyChart? Yes  Agent: Please be advised that Rx refills may take up to 3 business days. We ask that you follow-up with your pharmacy.

## 2023-07-14 NOTE — Telephone Encounter (Signed)
 Refills already sent

## 2023-07-14 NOTE — Telephone Encounter (Signed)
 Copied from CRM [id number]. Topic: Clinical - Medication Refill >> Jul 14, 2023  2:26 PM Isabell A wrote: Most Recent Primary Care Visit:  Provider: [facility]  [facility]  Visit Type: LAB  Date: 05/05/2023  Medication: ***  Has the patient contacted their pharmacy?  (Agent: If no, request that the patient contact the pharmacy for the refill. If patient does not wish to contact the pharmacy document the reason why and proceed with request.) (Agent: If yes, when and what did the pharmacy advise?)  Is this the correct pharmacy for this prescription?  If no, delete pharmacy and type the correct one.  This is the patient's preferred pharmacy:  [id number] [place], [place] - [address] [place] [address] Phone: [phone] Fax: [phone]  [facility] [address] [place] - [address] [facility] [place] [address] Phone: [phone] Fax: [phone]   Has the prescription been filled recently?   Is the patient out of the medication?   Has the patient been seen for an appointment in the last year OR does the patient have an upcoming appointment?   Can we respond through MyChart?   Agent: Please be advised that Rx refills may take up to 3 business days. We ask that you follow-up with your pharmacy.

## 2023-07-17 NOTE — Progress Notes (Deleted)
 Psychiatric Initial Adult Assessment  Patient Identification: Caitlin Pineda MRN:  [id number] Date of Evaluation:  07/17/2023 Referral Source: ***  Assessment:  Caitlin Pineda is a 53 y.o. female with a history of *** who presents in person to [facility] for medication management.  Patient reports ***  Plan:  # *** Past medication trials:  Status of problem: *** Interventions: -- ***  # *** Past medication trials:  Status of problem: *** Interventions: -- ***  # *** Past medication trials:  Status of problem: *** Interventions: -- ***  Return to care in ***  Patient was given contact information for behavioral health clinic and was instructed to call 911 for emergencies.    Patient and plan of care will be discussed with the Attending MD, Dr. ***, who agrees with the above statement and plan.   Subjective:  Chief Complaint: Medication Management  History of Present Illness:  ***  Past Psychiatric History:  Diagnoses: *** Medication trials: *** Previous psychiatrist/therapist: *** Hospitalizations: *** Suicide attempts: *** SIB: *** Hx of violence towards others: *** Current access to guns: *** Hx of trauma/abuse: ***  Substance Abuse History in the last 12 months:  {yes no:314532}  Past Medical History:  Past Medical History:  Diagnosis Date   Borderline personality disorder (HCC) 09/03/2015   COPD (chronic obstructive pulmonary disease) (HCC)    Disequilibrium 09/25/2018   HSV-2 infection    2023, [facility], tx with Valtrex   Hypertension    Lumbar radiculopathy 02/10/2017   Formatting of this note might be different from the original. Added automatically from request for surgery [id number]  Formatting of this note might be different from the original. Added automatically from request for surgery [id number]   Major depression, recurrent (HCC) 07/11/2015   Tobacco abuse 09/25/2018   Vulva cancer (HCC)     Past Surgical  History:  Procedure Laterality Date   CESAREAN SECTION     laser surgery for vulva cancer     TUBAL LIGATION      Family Psychiatric History: ***  Family History:  Family History  Problem Relation Age of Onset   Hypertension Mother    Hypertension Father    Diabetes Father    COPD Father    Gout Father     Social History:   Academic/Vocational: *** Social History   Socioeconomic History   Marital status: Single    Spouse name: Not on file   Number of children: Not on file   Years of education: Not on file   Highest education level: Not on file  Occupational History   Not on file  Tobacco Use   Smoking status: Every Day    Current packs/day: 1.50    Types: Cigarettes   Smokeless tobacco: Never  Substance and Sexual Activity   Alcohol use: Yes    Comment: 1 pint liquor every afternoon   Drug use: Yes    Types: Cocaine   Sexual activity: Not on file  Other Topics Concern   Not on file  Social History Narrative   ** Merged History Encounter **       Social Drivers of Health   Financial Resource Strain: Not on file  Food Insecurity: No Food Insecurity (12/24/2020)   Received from [facility]   Hunger Vital Sign    Worried About Running Out of Food in the Last Year: Never true    Ran Out of Food in the Last Year: Never true  Transportation Needs: Not on file  Physical Activity: Not on file  Stress: Not on file  Social Connections: Unknown (11/22/2021)   Received from [facility]   Social Network    Social Network: Not on file    Additional Social History: updated  Allergies:   Allergies  Allergen Reactions   Prednisone Hives    Current Medications: Current Outpatient Medications  Medication Sig Dispense Refill   diazepam  (VALIUM ) 5 MG tablet Take one tablet by mouth with food one hour prior to procedure. May repeat 30 minutes prior if needed. 2 tablet 0   albuterol  (VENTOLIN  HFA) 108 (90 Base) MCG/ACT inhaler  Inhale 2 puffs into the lungs every 6 (six) hours as needed for wheezing or shortness of breath. 18 g 5   BUTRANS  15 MCG/HR 1 patch once a week. (Patient not taking: Reported on 04/13/2023)     fluconazole  (DIFLUCAN ) 100 MG tablet Take 1 tablet (100 mg total) by mouth daily. 14 tablet 0   Fluticasone-Umeclidin-Vilant (TRELEGY ELLIPTA ) 100-62.5-25 MCG/ACT AEPB Inhale 1 puff into the lungs daily. 60 each 1   methocarbamol  (ROBAXIN ) 500 MG tablet Take 1 tablet (500 mg total) by mouth 3 (three) times daily. 90 tablet 0   pantoprazole  (PROTONIX ) 20 MG tablet Take 1 tablet (20 mg total) by mouth daily. 90 tablet 3   No current facility-administered medications for this visit.    ROS: Review of Systems ***  Objective:  Psychiatric Specialty Exam:  There were no vitals taken for this visit.There is no height or weight on file to calculate BMI.  General Appearance: {Appearance:22683}  Eye Contact:  {BHH EYE CONTACT:22684}  Speech:  {Speech:22685}  Volume:  {Volume (PAA):22686}  Mood:  {BHH MOOD:22306}  Affect:  {Affect (PAA):22687}  Thought Content: {Thought Content:22690}   Suicidal Thoughts:  {ST/HT (PAA):22692}  Homicidal Thoughts:  {ST/HT (PAA):22692}  Thought Process:  {Thought Process (PAA):22688}  Orientation:  {BHH ORIENTATION (PAA):22689}  Judgment:  {Judgement (PAA):22694}  Insight:  {Insight (PAA):22695}  Concentration:  {Concentration:21399}  Fund of Knowledge: {BHH GOOD/FAIR/POOR:22877}  Language: {BHH GOOD/FAIR/POOR:22877}  Psychomotor Activity:  {Psychomotor (PAA):22696}  Akathisia:  {BHH YES OR NO:22294}  AIMS (if indicated): {Desc; done/not:10129}  Assets:  {Assets (PAA):22698}  ADL's:  {BHH JIO'D:77709}  Cognition: {chl bhh cognition:304700322}      PE: General: well-appearing; no acute distress *** Pulm: no increased work of breathing on room air *** Strength & Muscle Tone: {desc; muscle tone:32375} Neuro: no focal neurological deficits observed *** Gait &  Station: {PE GAIT ED WJUO:77474}   Screenings:  Flowsheet Row ED from 08/21/2022 in [facility] Emergency Department at [facility] ED from 10/05/2020 in [facility] Emergency Department at [facility]  C-SSRS RISK CATEGORY No Risk Error: Question 2 not populated        Prentice Espy, MD 1/6/20259:06 PM

## 2023-07-18 ENCOUNTER — Ambulatory Visit (HOSPITAL_COMMUNITY): Payer: Medicaid Other | Admitting: Student

## 2023-07-20 ENCOUNTER — Encounter: Payer: Medicaid Other | Admitting: Physical Medicine and Rehabilitation

## 2023-07-20 ENCOUNTER — Ambulatory Visit: Payer: Self-pay | Admitting: Family Medicine

## 2023-07-20 NOTE — Telephone Encounter (Signed)
 Copied from CRM [id number]. Topic: Clinical - Medical Advice >> Jul 20, 2023  8:31 AM Kara C wrote: Reason for CRM: Patient requested a call back from a nurse to ask a medical question, patient did not want to disclose her reason why. Please follow up with patient [phone]  Chief Complaint: Diarrhea Symptoms: Diarrhea and nausea Frequency: Since Sunday night Pertinent Negatives: Patient denies fever Disposition: [] ED /[] Urgent Care (no appt availability in office) / [x] Appointment(In office/virtual)/ []  [facility]/ [] Home Care/ [] Refused Recommended Disposition /[] [facility]/ []  Follow-up with PCP Additional Notes: Patient called in reporting severe diarrhea and nausea that has been ongoing since Sunday night. Patient reported she had to call 911 and go to the ED. Patient stated she was treated for an infection and received Cipro in the ED. Patient reported that diarrhea has not stopped since she got home from ED. Patient described diarrhea as watery and reported that she is going more than 10 times a day. Patient also reported abdominal cramping. Patient denied vomiting and fever at this time. 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 Patient is experiencing dry mouth, but has been able to drink fluids. Patient reported that she has not been able to eat anything. This RN advised patient to try and start with bland foods. This RN advised patient to see provider within 24 hours. Scheduled patient an appointment with her PCP. This RN advised patient to call back if symptoms worsen and to do her best to maintain food and fluid intake. Patient complied.   Reason for Disposition  [1] SEVERE diarrhea (e.g., 7 or more times / day more than normal) AND [2] present > 24 hours (1 day)  Answer Assessment - Initial Assessment Questions 1. DIARRHEA SEVERITY: How bad is the diarrhea? How many more stools have you had in the past 24 hours  than normal?    - NO DIARRHEA (SCALE 0)   - MILD (SCALE 1-3): Few loose or mushy BMs; increase of 1-3 stools over normal daily number of stools; mild increase in ostomy output.   -  MODERATE (SCALE 4-7): Increase of 4-6 stools daily over normal; moderate increase in ostomy output.   -  SEVERE (SCALE 8-10; OR WORST POSSIBLE): Increase of 7 or more stools daily over normal; moderate increase in ostomy output; incontinence.     Patient reports at least more than 10 times a day  2. ONSET: When did the diarrhea begin?      Sunday night after work  3. BM CONSISTENCY: How loose or watery is the diarrhea?      Watery  4. VOMITING: Are you also vomiting? If Yes, ask: How many times in the past 24 hours?      Patient reports vomiting stopped early Monday morning  5. ABDOMEN PAIN: Are you having any abdomen pain? If Yes, ask: What does it feel like? (e.g., crampy, dull, intermittent, constant)      Patient states the pain feels like a really massive cramp  6. ABDOMEN PAIN SEVERITY: If present, ask: How bad is the pain?  (e.g., Scale 1-10; mild, moderate, or severe)   - MILD (1-3): doesn't interfere with normal activities, abdomen soft and not tender to touch    - MODERATE (4-7): interferes with normal activities or awakens from sleep, abdomen tender to touch    - SEVERE (8-10): excruciating pain, doubled over, unable to do any normal activities  Patient rates pain at about a 7  7. ORAL INTAKE: If vomiting, Have you been able to drink liquids? How much liquids have you had in the past 24 hours?     Patient states she has been drinking gatorade, but has not been able to eat a lot  8. HYDRATION: Any signs of dehydration? (e.g., dry mouth [not just dry lips], too weak to stand, dizziness, new weight loss) When did you last urinate?     Patient states her mouth feels dry  9. EXPOSURE: Have you traveled to a foreign country recently? Have you been exposed to anyone  with diarrhea? Could you have eaten any food that was spoiled?     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 ANTIBIOTIC USE: Are you taking antibiotics now or have you taken antibiotics in the past 2 months?       Patient states she received Cipro in the ED  11. OTHER SYMPTOMS: Do you have any other symptoms? (e.g., fever, blood in stool)       Patient denies fever and blood in stool at this time  Protocols used: [facility]

## 2023-07-21 ENCOUNTER — Ambulatory Visit: Payer: Medicaid Other | Admitting: Family Medicine

## 2023-07-21 ENCOUNTER — Institutional Professional Consult (permissible substitution): Payer: Medicaid Other | Admitting: Internal Medicine

## 2023-07-21 ENCOUNTER — Encounter: Payer: Self-pay | Admitting: Family Medicine

## 2023-07-21 VITALS — BP 127/70 | HR 70 | Ht 64.0 in | Wt 119.0 lb

## 2023-07-21 DIAGNOSIS — R110 Nausea: Secondary | ICD-10-CM

## 2023-07-21 DIAGNOSIS — R197 Diarrhea, unspecified: Secondary | ICD-10-CM

## 2023-07-21 LAB — COMPREHENSIVE METABOLIC PANEL
ALT: 25 U/L (ref 0–35)
AST: 24 U/L (ref 0–37)
Albumin: 3.8 g/dL (ref 3.5–5.2)
Alkaline Phosphatase: 92 U/L (ref 39–117)
BUN: 9 mg/dL (ref 6–23)
CO2: 21 meq/L (ref 19–32)
Calcium: 8.2 mg/dL — ABNORMAL LOW (ref 8.4–10.5)
Chloride: 112 meq/L (ref 96–112)
Creatinine, Ser: 0.57 mg/dL (ref 0.40–1.20)
GFR: 103.43 mL/min (ref >60.00)
Glucose, Bld: 89 mg/dL (ref 70–99)
Potassium: 4 meq/L (ref 3.5–5.1)
Sodium: 143 meq/L (ref 135–145)
Total Bilirubin: 0.2 mg/dL (ref 0.2–1.2)
Total Protein: 5.8 g/dL — ABNORMAL LOW (ref 6.0–8.3)

## 2023-07-21 LAB — CBC WITH DIFFERENTIAL/PLATELET
Basophils Absolute: 0 10*3/uL (ref 0.0–0.1)
Basophils Relative: 0.5 % (ref 0.0–3.0)
Eosinophils Absolute: 0.1 10*3/uL (ref 0.0–0.7)
Eosinophils Relative: 2.9 % (ref 0.0–5.0)
HCT: 46.2 % — ABNORMAL HIGH (ref 36.0–46.0)
Hemoglobin: 15.1 g/dL — ABNORMAL HIGH (ref 12.0–15.0)
Lymphocytes Relative: 23.9 % (ref 12.0–46.0)
Lymphs Abs: 1.1 10*3/uL (ref 0.7–4.0)
MCHC: 32.8 g/dL (ref 30.0–36.0)
MCV: 96.3 fL (ref 78.0–100.0)
Monocytes Absolute: 0.5 10*3/uL (ref 0.1–1.0)
Monocytes Relative: 9.9 % (ref 3.0–12.0)
Neutro Abs: 2.9 10*3/uL (ref 1.4–7.7)
Neutrophils Relative %: 62.8 % (ref 43.0–77.0)
Platelets: 242 10*3/uL (ref 150.0–400.0)
RBC: 4.8 Mil/uL (ref 3.87–5.11)
RDW: 14.1 % (ref 11.5–15.5)
WBC: 4.7 10*3/uL (ref 4.0–10.5)

## 2023-07-21 MED ORDER — ONDANSETRON 8 MG PO TBDP: 8.0000 mg | ORAL_TABLET | Freq: Three times a day (TID) | ORAL | 0 refills | Status: DC | PRN

## 2023-07-21 NOTE — Progress Notes (Signed)
 Acute Office Visit  Subjective:     Patient ID: Caitlin Pineda, female    DOB: 07/31/1970, 53 y.o.   MRN: [id number]  Chief Complaint  Patient presents with   Diarrhea     Patient is in today for diarrhea.   Discussed the use of AI scribe software for clinical note transcription with the patient, who gave verbal consent to proceed.  History of Present Illness   The patient, with a history of COPD and emphysema, presents with a three-week history of respiratory distress and violent, non-stop coughing. The symptoms began after the patient contracted a viral illness, which progressed to the point where they experienced significant dyspnea even with minimal exertion. Initial treatment at an urgent care facility included a Z-Pak, but the patient's condition did not improve and they developed severe wheezing. Subsequent hospitalization led to a diagnosis of COPD exacerbation, for which they received steroids. Despite this, the patient's condition did not improve, leading to a second urgent care visit where they were prescribed doxycycline.  In addition to the respiratory symptoms, the patient also reports a recent episode of acute gastroenteritis. This began after consuming a meal of hamburger helper, which was followed by severe vomiting and diarrhea. The vomiting has since resolved, but the patient continues to experience diarrhea and significant abdominal discomfort, described as bloating. The patient's stool has started to become more formed, but remains loose.  The patient also reports difficulty working due to their current health status and a concurrent back issue. They were scheduled for a back injection, but this was postponed due to their illness. The patient's respiratory symptoms have shown some improvement with a recent course of Cipro, and they have an upcoming appointment with a pulmonologist later today.               All review of systems negative except what is  listed in the HPI      Objective:    BP 127/70   Pulse 70   Ht 5' 4 (1.626 m)   Wt 119 lb (54 kg)   SpO2 99%   BMI 20.43 kg/m    Physical Exam Vitals reviewed.  Constitutional:      Appearance: Normal appearance.  Cardiovascular:     Rate and Rhythm: Normal rate.  Pulmonary:     Effort: Pulmonary effort is normal.     Breath sounds: No wheezing or rhonchi.  Abdominal:     General: Abdomen is flat. There is no distension.     Palpations: Abdomen is soft. There is no mass.     Tenderness: There is no abdominal tenderness. There is no guarding or rebound.  Skin:    General: Skin is warm and dry.     Comments: Small abrasion to left lower back/hip  Neurological:     Mental Status: She is alert and oriented to person, place, and time.  Psychiatric:        Mood and Affect: Mood normal.        Behavior: Behavior normal.        Thought Content: Thought content normal.        Judgment: Judgment normal.      No results found for any visits on 07/21/23.      Assessment & Plan:   Problem List Items Addressed This Visit   None Visit Diagnoses       Diarrhea, unspecified type    -  Primary   Relevant Orders   CBC with Differential/Platelet  Comprehensive metabolic panel   Stool culture   Clostridium Difficile by PCR     Nausea       Relevant Medications   ondansetron  (ZOFRAN -ODT) 8 MG disintegrating tablet          COPD exacerbation Recent severe exacerbation with wheezing and dyspnea. Multiple courses of antibiotics and steroids now feeling much better. Pulmonology appointment scheduled for today. -Continue current treatment and follow up with pulmonology.  Acute gastroenteritis Recent onset of vomiting and diarrhea after possible food poisoning. Diarrhea is improving but still present. Nausea persists. Recent antibiotic use and exposure to nursing home environment increase risk for C. diff. -Order stool sample for C. diff and culture if diarrhea persists   -Check blood counts and electrolytes. -Advise on hydration and probiotic use. -Prescribe anti-nausea medication.          Meds ordered this encounter  Medications   ondansetron  (ZOFRAN -ODT) 8 MG disintegrating tablet    Sig: Take 1 tablet (8 mg total) by mouth every 8 (eight) hours as needed for nausea or vomiting.    Dispense:  20 tablet    Refill:  0    Supervising Provider:   DOMENICA BLACKBIRD A [[id number]]    Return if symptoms worsen or fail to improve.  Waddell KATHEE Mon, NP

## 2023-07-21 NOTE — Progress Notes (Deleted)
 Caitlin Pineda, female    DOB: June 27, 1971   MRN: [id number]   Brief patient profile:  67  *** referred to pulmonary clinic 07/21/2023 by *** for ***        History of Present Illness  07/21/2023  Pulmonary/ 1st office eval/Caitlin Pineda  No chief complaint on file.    Dyspnea:  *** Cough: *** Sleep: *** SABA use: *** 02 use:*** LDSCT:***  No obvious day to day or daytime pattern/variability or assoc excess/ purulent sputum or mucus plugs or hemoptysis or cp or chest tightness, subjective wheeze or overt sinus or hb symptoms.    Also denies any obvious fluctuation of symptoms with weather or environmental changes or other aggravating or alleviating factors except as outlined above   No unusual exposure hx or h/o childhood pna/ asthma or knowledge of premature birth.  Current Allergies, Complete Past Medical History, Past Surgical History, Family History, and Social History were reviewed in [name] record.  ROS  The following are not active complaints unless bolded Hoarseness, sore throat, dysphagia, dental problems, itching, sneezing,  nasal congestion or discharge of excess mucus or purulent secretions, ear ache,   fever, chills, sweats, unintended wt loss or wt gain, classically pleuritic or exertional cp,  orthopnea pnd or arm/hand swelling  or leg swelling, presyncope, palpitations, abdominal pain, anorexia, nausea, vomiting, diarrhea  or change in bowel habits or change in bladder habits, change in stools or change in urine, dysuria, hematuria,  rash, arthralgias, visual complaints, headache, numbness, weakness or ataxia or problems with walking or coordination,  change in mood or  memory.             Outpatient Medications Prior to Visit  Medication Sig Dispense Refill   diazepam  (VALIUM ) 5 MG tablet Take one tablet by mouth with food one hour prior to procedure. May repeat 30 minutes prior if needed. 2 tablet 0   albuterol  (VENTOLIN  HFA) 108 (90 Base)  MCG/ACT inhaler Inhale 2 puffs into the lungs every 6 (six) hours as needed for wheezing or shortness of breath. 18 g 5   BUTRANS  15 MCG/HR 1 patch once a week. (Patient not taking: Reported on 04/13/2023)     fluconazole  (DIFLUCAN ) 100 MG tablet Take 1 tablet (100 mg total) by mouth daily. 14 tablet 0   Fluticasone-Umeclidin-Vilant (TRELEGY ELLIPTA ) 100-62.5-25 MCG/ACT AEPB Inhale 1 puff into the lungs daily. 60 each 1   methocarbamol  (ROBAXIN ) 500 MG tablet Take 1 tablet (500 mg total) by mouth 3 (three) times daily. 90 tablet 0   pantoprazole  (PROTONIX ) 20 MG tablet Take 1 tablet (20 mg total) by mouth daily. 90 tablet 3   No facility-administered medications prior to visit.    Past Medical History:  Diagnosis Date   Borderline personality disorder (HCC) 09/03/2015   COPD (chronic obstructive pulmonary disease) (HCC)    Disequilibrium 09/25/2018   HSV-2 infection    2023, [facility], tx with Valtrex   Hypertension    Lumbar radiculopathy 02/10/2017   Formatting of this note might be different from the original. Added automatically from request for surgery [id number]  Formatting of this note might be different from the original. Added automatically from request for surgery [id number]   Major depression, recurrent (HCC) 07/11/2015   Tobacco abuse 09/25/2018   Vulva cancer (HCC)       Objective:     There were no vitals taken for this visit.         Assessment   No  problem-specific Assessment & Plan notes found for this encounter.     Caitlin America, MD 07/21/2023

## 2023-07-21 NOTE — Addendum Note (Signed)
 Addended by: Hyman Hopes B on: 07/21/2023 08:06 PM   Modules accepted: Orders

## 2023-07-24 ENCOUNTER — Telehealth: Payer: Self-pay

## 2023-07-24 NOTE — Telephone Encounter (Signed)
 Patient just needed to schedule lab appt. Called and made appt.

## 2023-07-24 NOTE — Telephone Encounter (Signed)
 Copied from CRM [id number]. Topic: Clinical - Lab/Test Results >> Jul 24, 2023 12:01 PM Dennison Nancy wrote: Reason for CRM: Patient returning  a call from [place] and would like Jade to call her back to go over test results

## 2023-08-01 ENCOUNTER — Other Ambulatory Visit: Payer: Medicaid Other

## 2023-08-01 ENCOUNTER — Encounter: Payer: Medicaid Other | Admitting: Physical Medicine and Rehabilitation

## 2023-08-03 ENCOUNTER — Ambulatory Visit: Payer: Medicaid Other | Admitting: Physical Medicine and Rehabilitation

## 2023-08-08 ENCOUNTER — Encounter
Payer: Medicaid Other | Attending: Physical Medicine and Rehabilitation | Admitting: Physical Medicine and Rehabilitation

## 2023-08-08 VITALS — BP 123/76 | HR 62 | Ht 64.0 in | Wt 121.0 lb

## 2023-08-08 DIAGNOSIS — Z79891 Long term (current) use of opiate analgesic: Secondary | ICD-10-CM | POA: Diagnosis present

## 2023-08-08 DIAGNOSIS — G894 Chronic pain syndrome: Secondary | ICD-10-CM | POA: Diagnosis not present

## 2023-08-08 DIAGNOSIS — Z5181 Encounter for therapeutic drug level monitoring: Secondary | ICD-10-CM | POA: Insufficient documentation

## 2023-08-08 DIAGNOSIS — M48062 Spinal stenosis, lumbar region with neurogenic claudication: Secondary | ICD-10-CM | POA: Diagnosis present

## 2023-08-08 DIAGNOSIS — L308 Other specified dermatitis: Secondary | ICD-10-CM | POA: Diagnosis not present

## 2023-08-08 NOTE — Patient Instructions (Signed)
Foods that may reduce pain: 1) Ginger (especially studied for arthritis)- reduce leukotriene production to decrease inflammation 2) Blueberries- high in phytonutrients that decrease inflammation 3) Salmon- marine omega-3s reduce joint swelling and pain 4) Pumpkin seeds- reduce inflammation 5) dark chocolate- reduces inflammation 6) turmeric- reduces inflammation 7) tart cherries - reduce pain and stiffness 8) extra virgin olive oil - its compound olecanthal helps to block prostaglandins  9) chili peppers- can be eaten or applied topically via capsaicin 10) mint- helpful for headache, muscle aches, joint pain, and itching 11) garlic- reduces inflammation  Link to further information on diet for chronic pain: [email or web address]

## 2023-08-08 NOTE — Progress Notes (Signed)
Subjective:    Patient ID: Caitlin Pineda, female    DOB: 03-20-1971, 53 y.o.   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 General activity 10 Relation with others 0 Enjoyment of life 10 What TIME of day is your pain at its worst? morning , daytime, evening, and night Sleep (in general) Poor  Pain is worse with: walking, standing, and some activites Pain improves with: rest, heat/ice, medication, and cratom Relief from Meds: 7  how many minutes can you walk? 5-10 ability to climb steps?  yes do you drive?  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  weakness trouble walking depression  New pt  New pt    Family History  Problem Relation Age of Onset   Hypertension Mother    Hypertension Father    Diabetes Father    COPD Father    Gout Father    Social History   Socioeconomic History   Marital status: Single    Spouse name: Not on file   Number of children: Not on file   Years of education: Not on file   Highest education level: Not on file  Occupational History   Not on file  Tobacco Use   Smoking status: Every Day    Current packs/day: 1.50    Types: Cigarettes   Smokeless tobacco: Never  Substance and  Sexual Activity   Alcohol use: Yes    Comment: 1 pint liquor every afternoon   Drug use: Yes    Types: Cocaine   Sexual activity: Not on file  Other Topics Concern   Not on file  Social History Narrative   ** Merged History Encounter **       Social Drivers of Health   Financial Resource Strain: Not on file  Food Insecurity: No Food Insecurity (12/24/2020)   Received from [facility], [facility]   Hunger Vital Sign    Worried About Running Out of Food in the Last Year: Never true    Ran Out of Food in the Last Year: Never true  Transportation Needs: Not on file  Physical Activity: Not on file  Stress: Not on file  Social Connections: Unknown (11/22/2021)   Received from [facility], [facility]   Social Network    Social Network: Not on file   Past Surgical History:  Procedure Laterality Date   CESAREAN SECTION     laser surgery for vulva cancer     TUBAL LIGATION     Past Medical History:  Diagnosis Date   Borderline personality disorder (HCC) 09/03/2015   COPD (chronic obstructive pulmonary disease) (HCC)    Disequilibrium 09/25/2018   HSV-2 infection  2023, [facility], tx with Valtrex   Hypertension    Lumbar radiculopathy 02/10/2017   Formatting of this note might be different from the original. Added automatically from request for surgery [id number]  Formatting of this note might be different from the original. Added automatically from request for surgery [id number]   Major depression, recurrent (HCC) 07/11/2015   Tobacco abuse 09/25/2018   Vulva cancer (HCC)    BP 123/76   Pulse 62   Ht 5\' 4"  (1.626 m)   Wt 121 lb (54.9 kg)   SpO2 94%   BMI 20.77 kg/m   Opioid Risk Score:   Fall Risk Score:  `1  Depression screen [facility] 2/9     08/08/2023    9:24 AM  Depression screen PHQ 2/9  Decreased Interest 3  Down, Depressed, Hopeless 2  PHQ - 2 Score 5  Altered sleeping 2  Tired, decreased energy 2  Change in appetite 0  Feeling bad or failure  about yourself  2  Trouble concentrating 0  Moving slowly or fidgety/restless 0  Suicidal thoughts 0  PHQ-9 Score 11  Difficult doing work/chores Not difficult at all     Review of Systems  Musculoskeletal:  Positive for back pain.       Right hip pain  Neurological:  Positive for weakness.  All other systems reviewed and are negative.     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  -Discussed Qutenza as an option for neuropathic pain control. Discussed that this is a capsaicin patch, stronger than capsaicin cream. Discussed that it is currently approved for diabetic peripheral neuropathy and post-herpetic neuralgia, but that it has also shown benefit in treating other forms of neuropathy. Provided patient with link to site to learn more about the patch: https://www.clark.biz/. Discussed that the patch would be placed in office and benefits usually last 3 months. Discussed that unintended exposure to capsaicin can cause severe irritation of eyes, mucous membranes, respiratory tract, and skin, but that Qutenza is a local treatment and does not have the systemic side effects of other nerve medications. Discussed that there may be pain, itching, erythema, and decreased sensory function associated with the  application of Qutenza. Side effects usually subside within 1 week. A cold pack of analgesic medications can help with these side effects. Blood pressure can also be increased due to pain associated with administration of the patch.  -Discussed benefits of exercise in reducing pain. -Discussed following foods that may reduce pain: 1) Ginger (especially studied for arthritis)- reduce leukotriene production to decrease inflammation 2) Blueberries- high in phytonutrients that decrease inflammation 3) Salmon- marine omega-3s reduce joint swelling and pain 4) Pumpkin seeds- reduce inflammation 5) dark chocolate- reduces inflammation 6) turmeric- reduces inflammation 7) tart cherries - reduce pain and stiffness 8) extra virgin olive oil - its  compound olecanthal helps to block prostaglandins  9) chili peppers- can be eaten or applied topically via capsaicin 10) mint- helpful for headache, muscle aches, joint pain, and itching 11) garlic- reduces inflammation  2) Pruritus from Butrans patch: -advised applying sarna lotion or coconut oil

## 2023-08-08 NOTE — Addendum Note (Signed)
Addended by: Silas Sacramento T on: 08/08/2023 10:20 AM   Modules accepted: Orders

## 2023-08-11 ENCOUNTER — Other Ambulatory Visit: Payer: Self-pay | Admitting: Physical Medicine and Rehabilitation

## 2023-08-11 ENCOUNTER — Telehealth: Payer: Self-pay

## 2023-08-11 LAB — TOXASSURE SELECT,+ANTIDEPR,UR

## 2023-08-11 MED ORDER — HYDROCODONE-ACETAMINOPHEN 5-325 MG PO TABS: 1.0000 | ORAL_TABLET | Freq: Four times a day (QID) | ORAL | 0 refills | Status: DC | PRN

## 2023-08-11 MED ORDER — BUPRENORPHINE 20 MCG/HR TD PTWK: 1.0000 | MEDICATED_PATCH | TRANSDERMAL | 0 refills | Status: DC

## 2023-08-11 NOTE — Telephone Encounter (Signed)
Results for UDS so patient can get medications.

## 2023-08-14 ENCOUNTER — Other Ambulatory Visit: Payer: Medicaid Other

## 2023-08-15 ENCOUNTER — Encounter: Payer: Medicaid Other | Admitting: Physical Medicine and Rehabilitation

## 2023-08-17 ENCOUNTER — Telehealth: Payer: Self-pay | Admitting: Physical Medicine and Rehabilitation

## 2023-08-17 NOTE — Telephone Encounter (Signed)
 Requesting a refill on hydrocodone . Pharmacy only dispensed 5 days. Also inquiring on PA for Butrans  patches.   [facility] and that is not her pharmacy of choice.  She uses [facility].

## 2023-08-17 NOTE — Telephone Encounter (Signed)
 Patient could only get 5 days of Hydrocodone  per pharmacy. Please call patient. She has not been able to get Butrans  patch as well. Will be out of meds tomorrow morning. Please call patient.

## 2023-08-18 ENCOUNTER — Other Ambulatory Visit: Payer: Self-pay | Admitting: Physical Medicine and Rehabilitation

## 2023-08-18 ENCOUNTER — Telehealth: Payer: Self-pay

## 2023-08-18 NOTE — Telephone Encounter (Signed)
 Patient take #20 of the Hydrocodone  from the pharmacy and forfeited the rest of the prescription because she was waiting on approval for insurance now she has the approval but pharmacy needs another prescription to fill medication

## 2023-08-18 NOTE — Telephone Encounter (Signed)
 PA for Hydrocodone  and Butrans  submitted

## 2023-08-18 NOTE — Telephone Encounter (Signed)
 submitted

## 2023-08-18 NOTE — Telephone Encounter (Signed)
 Approved today by [employer] 2017 Approved. BUTRANS  20MCG/HR Patch Weekly is approved from 08/18/2023 to 11/15/2023.  Approved today by [employer] 2017 Approved. HYDROCODONE -ACETAMINOPHEN  5/325/MG Tablet is approved from 08/18/2023 to 02/15/2024. Authorization Expiration Date: 02/15/2024

## 2023-08-21 ENCOUNTER — Other Ambulatory Visit: Payer: Self-pay | Admitting: Physical Medicine and Rehabilitation

## 2023-08-21 MED ORDER — HYDROCODONE-ACETAMINOPHEN 5-325 MG PO TABS: 1.0000 | ORAL_TABLET | Freq: Four times a day (QID) | ORAL | 0 refills | Status: DC | PRN

## 2023-08-31 ENCOUNTER — Institutional Professional Consult (permissible substitution): Payer: Medicaid Other | Admitting: Internal Medicine

## 2023-09-04 ENCOUNTER — Encounter: Payer: Medicaid Other | Attending: Physical Medicine and Rehabilitation | Admitting: Registered Nurse

## 2023-09-04 DIAGNOSIS — Z79891 Long term (current) use of opiate analgesic: Secondary | ICD-10-CM | POA: Insufficient documentation

## 2023-09-04 DIAGNOSIS — L308 Other specified dermatitis: Secondary | ICD-10-CM | POA: Insufficient documentation

## 2023-09-04 DIAGNOSIS — Z5181 Encounter for therapeutic drug level monitoring: Secondary | ICD-10-CM | POA: Insufficient documentation

## 2023-09-04 DIAGNOSIS — G894 Chronic pain syndrome: Secondary | ICD-10-CM | POA: Insufficient documentation

## 2023-09-04 DIAGNOSIS — M48062 Spinal stenosis, lumbar region with neurogenic claudication: Secondary | ICD-10-CM | POA: Insufficient documentation

## 2023-09-04 NOTE — Progress Notes (Deleted)
 Subjective:    Patient ID: Caitlin Pineda, female    DOB: [birth year], 53 y.o.   MRN: [id number]  HPI   Pain Inventory Average Pain {NUMBERS; 0-10:5044} Pain Right Now {NUMBERS; 0-10:5044} My pain is {PAIN DESCRIPTION:21022940}  In the last 24 hours, has pain interfered with the following? General activity {NUMBERS; 0-10:5044} Relation with others {NUMBERS; 0-10:5044} Enjoyment of life {NUMBERS; 0-10:5044} What TIME of day is your pain at its worst? {time of day:24191} Sleep (in general) {BHH GOOD/FAIR/POOR:22877}  Pain is worse with: {ACTIVITIES:21022942} Pain improves with: {PAIN IMPROVES NWGN:56213086} Relief from Meds: {NUMBERS; 0-10:5044}  Family History  Problem Relation Age of Onset   Hypertension Mother    Hypertension Father    Diabetes Father    COPD Father    Gout Father    Social History   Socioeconomic History   Marital status: Single    Spouse name: Not on file   Number of children: Not on file   Years of education: Not on file   Highest education level: Not on file  Occupational History   Not on file  Tobacco Use   Smoking status: Every Day    Current packs/day: 1.50    Types: Cigarettes   Smokeless tobacco: Never  Substance and Sexual Activity   Alcohol use: Yes    Comment: 1 pint liquor every afternoon   Drug use: Yes    Types: Cocaine   Sexual activity: Not on file  Other Topics Concern   Not on file  Social History Narrative   ** Merged History Encounter **       Social Drivers of Corporate investment banker Strain: Not on file  Food Insecurity: No Food Insecurity (12/24/2020)   Received from [facility], [facility]   Hunger Vital Sign    Worried About Running Out of Food in the Last Year: Never true    Ran Out of Food in the Last Year: Never true  Transportation Needs: Not on file  Physical Activity: Not on file  Stress: Not on file  Social Connections: Unknown (11/22/2021)   Received from [facility], [facility]    Social Network    Social Network: Not on file   Past Surgical History:  Procedure Laterality Date   CESAREAN SECTION     laser surgery for vulva cancer     TUBAL LIGATION     Past Surgical History:  Procedure Laterality Date   CESAREAN SECTION     laser surgery for vulva cancer     TUBAL LIGATION     Past Medical History:  Diagnosis Date   Borderline personality disorder (HCC) 09/03/2015   COPD (chronic obstructive pulmonary disease) (HCC)    Disequilibrium 09/25/2018   HSV-2 infection    2023, [facility], tx with Valtrex   Hypertension    Lumbar radiculopathy 02/10/2017   Formatting of this note might be different from the original. Added automatically from request for surgery [id number]  Formatting of this note might be different from the original. Added automatically from request for surgery [id number]   Major depression, recurrent (HCC) 07/11/2015   Tobacco abuse 09/25/2018   Vulva cancer (HCC)    There were no vitals taken for this visit.  Opioid Risk Score:   Fall Risk Score:  `1  Depression screen [facility] 2/9     08/08/2023    9:24 AM  Depression screen PHQ 2/9  Decreased Interest 3  Down, Depressed, Hopeless 2  PHQ - 2 Score  5  Altered sleeping 2  Tired, decreased energy 2  Change in appetite 0  Feeling bad or failure about yourself  2  Trouble concentrating 0  Moving slowly or fidgety/restless 0  Suicidal thoughts 0  PHQ-9 Score 11  Difficult doing work/chores Not difficult at all    Review of Systems     Objective:   Physical Exam        Assessment & Plan:

## 2023-09-23 NOTE — Progress Notes (Deleted)
 Caitlin Pineda, female    DOB: Dec 03, 1970   MRN: [id number]   Brief patient profile:  21 yowf   *** referred to pulmonary clinic 09/25/2023 by *** for ***        History of Present Illness  09/25/2023  Pulmonary/ 1st office eval/Aydin Hink  No chief complaint on file.    Dyspnea:  *** Cough: *** Sleep: *** SABA use: *** 02 use:*** LDSCT:***  No obvious day to day or daytime pattern/variability or assoc excess/ purulent sputum or mucus plugs or hemoptysis or cp or chest tightness, subjective wheeze or overt sinus or hb symptoms.    Also denies any obvious fluctuation of symptoms with weather or environmental changes or other aggravating or alleviating factors except as outlined above   No unusual exposure hx or h/o childhood pna/ asthma or knowledge of premature birth.  Current Allergies, Complete Past Medical History, Past Surgical History, Family History, and Social History were reviewed in [name] record.  ROS  The following are not active complaints unless bolded Hoarseness, sore throat, dysphagia, dental problems, itching, sneezing,  nasal congestion or discharge of excess mucus or purulent secretions, ear ache,   fever, chills, sweats, unintended wt loss or wt gain, classically pleuritic or exertional cp,  orthopnea pnd or arm/hand swelling  or leg swelling, presyncope, palpitations, abdominal pain, anorexia, nausea, vomiting, diarrhea  or change in bowel habits or change in bladder habits, change in stools or change in urine, dysuria, hematuria,  rash, arthralgias, visual complaints, headache, numbness, weakness or ataxia or problems with walking or coordination,  change in mood or  memory.             Outpatient Medications Prior to Visit  Medication Sig Dispense Refill   diazepam (VALIUM) 5 MG tablet Take one tablet by mouth with food one hour prior to procedure. May repeat 30 minutes prior if needed. (Patient not taking: Reported on 08/08/2023) 2  tablet 0   albuterol (VENTOLIN HFA) 108 (90 Base) MCG/ACT inhaler Inhale 2 puffs into the lungs every 6 (six) hours as needed for wheezing or shortness of breath. 18 g 5   buprenorphine (BUTRANS) 20 MCG/HR PTWK Place 1 patch onto the skin once a week. 4 patch 0   Fluticasone-Umeclidin-Vilant (TRELEGY ELLIPTA) 100-62.5-25 MCG/ACT AEPB Inhale 1 puff into the lungs daily. 60 each 1   HYDROcodone-acetaminophen (NORCO) 5-325 MG tablet Take 1 tablet by mouth every 6 (six) hours as needed for moderate pain (pain score 4-6). 120 tablet 0   methocarbamol (ROBAXIN) 500 MG tablet Take 1 tablet (500 mg total) by mouth 3 (three) times daily. 90 tablet 0   pantoprazole (PROTONIX) 20 MG tablet Take 1 tablet (20 mg total) by mouth daily. (Patient not taking: Reported on 08/08/2023) 90 tablet 3   No facility-administered medications prior to visit.    Past Medical History:  Diagnosis Date   Borderline personality disorder (HCC) 09/03/2015   COPD (chronic obstructive pulmonary disease) (HCC)    Disequilibrium 09/25/2018   HSV-2 infection    2023, [facility], tx with Valtrex   Hypertension    Lumbar radiculopathy 02/10/2017   Formatting of this note might be different from the original. Added automatically from request for surgery [id number]  Formatting of this note might be different from the original. Added automatically from request for surgery [id number]   Major depression, recurrent (HCC) 07/11/2015   Tobacco abuse 09/25/2018   Vulva cancer (HCC)       Objective:  There were no vitals taken for this visit.         Assessment   No problem-specific Assessment & Plan notes found for this encounter.     Vernestine Gondola, MD 09/23/2023

## 2023-09-25 ENCOUNTER — Institutional Professional Consult (permissible substitution): Payer: Medicaid Other | Admitting: Internal Medicine

## 2023-09-25 ENCOUNTER — Encounter: Payer: Self-pay | Admitting: Internal Medicine

## 2023-09-27 ENCOUNTER — Ambulatory Visit: Payer: Self-pay

## 2023-09-27 NOTE — Telephone Encounter (Signed)
 Chief Complaint: chest pain Symptoms: chest pain, SOB Frequency: intermittently for 2 wks and worsening last night Pertinent Negatives: Patient denies N/V, dizziness, diaphoresis Disposition: [x] ED /[] Urgent Care (no appt availability in office) / [] Appointment(In office/virtual)/ []  [facility]/ [] Home Care/ [] Refused Recommended Disposition /[]  Mobile Bus/ []  Follow-up with PCP  Additional Notes: Pt reports L-sided chest pain. Pt states it started 2 wks ago and was intermittent. Pt states it has now been constant since last night. Pt states she barely slept last night due to the pain. Pt states it radiates into her back and in between her shoulder blades. Last night the pain was 7/10 and today it is a 5/10. Pt denies SOB on the call but states she was very SOB at work today, more so than usual for her (hx of COPD). Pt called EMS last night and they came out to the house and did an EKG which they said "looked OK." Pt declined transport to the hospital at that time. After EMS left, pain only worsened. Pt endorses headache, "fluttering" in her chest, and an episode of lightheadedness this AM. Denies dizziness, N/V, diaphoresis. Pt does not have her gallbladder.  RN advised pt she needs to go to the ED. Pt agreeable. RN called 911 for the pt. At the beginning of the call with 911 dispatch RN's computer dropped both calls. 911 dispatch called RN back. RN provided dispatch all information, dispatch on the way. RN called pt back. RN advised pt to unlock the door. Pt said EMS had arrived. RN ended the call.    Copied from CRM [id number]. Topic: Clinical - Red Word Triage >> Sep 27, 2023  4:23 PM Sim Boast F wrote: Red Word that prompted transfer to Nurse Triage: Patient has been having severe chest pain since last night Reason for Disposition  [1] Chest pain lasts > 5 minutes AND [2] age > 69  Answer Assessment - Initial Assessment Questions 1. LOCATION: "Where does it hurt?"       L  sided CP into back 2. RADIATION: "Does the pain go anywhere else?" (e.g., into neck, jaw, arms, back)     Into back/between shoulder blades last night, radiates to the L leg. Leg felt weak this AM. 3. ONSET: "When did the chest pain begin?" (Minutes, hours or days)      2 wks ago, worse now 4. PATTERN: "Does the pain come and go, or has it been constant since it started?"  "Does it get worse with exertion?"      Constant 5. DURATION: "How long does it last" (e.g., seconds, minutes, hours)     Nonstop, constant 6. SEVERITY: "How bad is the pain?"  (e.g., Scale 1-10; mild, moderate, or severe)    - MILD (1-3): doesn't interfere with normal activities     - MODERATE (4-7): interferes with normal activities or awakens from sleep    - SEVERE (8-10): excruciating pain, unable to do any normal activities       5/10 7. CARDIAC RISK FACTORS: "Do you have any history of heart problems or risk factors for heart disease?" (e.g., angina, prior heart attack; diabetes, high blood pressure, high cholesterol, smoker, or strong family history of heart disease)     HTN 8. PULMONARY RISK FACTORS: "Do you have any history of lung disease?"  (e.g., blood clots in lung, asthma, emphysema, birth control pills)     COPD 9. CAUSE: "What do you think is causing the chest pain?"  Not sure 10. OTHER SYMPTOMS: "Do you have any other symptoms?" (e.g., dizziness, nausea, vomiting, sweating, fever, difficulty breathing, cough)       Pt states pain has gotten worse in the last 3 days. Pt went to UC and got a steroid shot in her back. Has degenerative disc disease. Been moving mattresses at work. States this is a separate issue. EMS came last night to her house. 150/something BP w/ EMS. Did an EKG - EMS said it "looked OK." All night last night pain was constant, up all night, L-sided CP and into the back between the shoulder blades. 7/10 pain last night. 5/10 pain right now. Nonstop. Before it would come and go. Soreness to L  side of breast on palpation. Today at work pt states she was very out of breath. "Feel ok right now." "I felt a little lightheaded this AM and I have had a headache." "A couple of times I felt something in my chest like a flutter." No blurry vision. Never had CP like this. Has had to cut her days down at work d/t back pain.  Protocols used: Chest Pain-A-AH

## 2023-09-28 ENCOUNTER — Other Ambulatory Visit

## 2023-09-28 ENCOUNTER — Ambulatory Visit: Admitting: Family Medicine

## 2023-09-28 ENCOUNTER — Telehealth: Payer: Self-pay | Admitting: Family Medicine

## 2023-09-28 NOTE — Telephone Encounter (Signed)
 Don't see ED note in our system or Care Everywhere

## 2023-09-28 NOTE — Telephone Encounter (Signed)
 Copied from CRM [id number]. Topic: Appointments - Appointment Cancel/Reschedule >> Sep 28, 2023  1:04 PM Sim Boast F wrote: Patient called at 1:01 regarding todays appointment, says she went to the wrong location and will call back to reschedule.

## 2023-09-29 ENCOUNTER — Encounter: Payer: Self-pay | Admitting: Medical

## 2023-09-29 ENCOUNTER — Ambulatory Visit: Admitting: Medical

## 2023-09-29 ENCOUNTER — Ambulatory Visit (HOSPITAL_BASED_OUTPATIENT_CLINIC_OR_DEPARTMENT_OTHER)
Admission: RE | Admit: 2023-09-29 | Discharge: 2023-09-29 | Disposition: A | Discharge status: Home / Self Care | Source: Ambulatory Visit | Attending: Medical | Admitting: Medical

## 2023-09-29 VITALS — BP 138/80 | HR 60 | Temp 98.0°F | Resp 18 | Ht 64.0 in | Wt 118.0 lb

## 2023-09-29 DIAGNOSIS — R079 Chest pain, unspecified: Secondary | ICD-10-CM

## 2023-09-29 DIAGNOSIS — F17200 Nicotine dependence, unspecified, uncomplicated: Secondary | ICD-10-CM | POA: Insufficient documentation

## 2023-09-29 DIAGNOSIS — D75839 Thrombocytosis, unspecified: Secondary | ICD-10-CM

## 2023-09-29 DIAGNOSIS — F439 Reaction to severe stress, unspecified: Secondary | ICD-10-CM

## 2023-09-29 DIAGNOSIS — M546 Pain in thoracic spine: Secondary | ICD-10-CM | POA: Diagnosis not present

## 2023-09-29 LAB — CBC WITH DIFFERENTIAL/PLATELET
Basophils Absolute: 0 10*3/uL (ref 0.0–0.1)
Basophils Relative: 0.2 % (ref 0.0–3.0)
Eosinophils Absolute: 0 10*3/uL (ref 0.0–0.7)
Eosinophils Relative: 0.1 % (ref 0.0–5.0)
HCT: 42.8 % (ref 36.0–46.0)
Hemoglobin: 13.7 g/dL (ref 12.0–15.0)
Lymphocytes Relative: 10 % — ABNORMAL LOW (ref 12.0–46.0)
Lymphs Abs: 0.7 10*3/uL (ref 0.7–4.0)
MCHC: 31.9 g/dL (ref 30.0–36.0)
MCV: 95.2 fl (ref 78.0–100.0)
Monocytes Absolute: 0.2 10*3/uL (ref 0.1–1.0)
Monocytes Relative: 3.1 % (ref 3.0–12.0)
Neutro Abs: 6.2 10*3/uL (ref 1.4–7.7)
Neutrophils Relative %: 86.6 % — ABNORMAL HIGH (ref 43.0–77.0)
Platelets: 254 10*3/uL (ref 150.0–400.0)
RBC: 4.5 Mil/uL (ref 3.87–5.11)
RDW: 14 % (ref 11.5–15.5)
WBC: 7.2 10*3/uL (ref 4.0–10.5)

## 2023-09-29 LAB — COMPREHENSIVE METABOLIC PANEL
ALT: 9 U/L (ref 0–35)
AST: 13 U/L (ref 0–37)
Albumin: 4.2 g/dL (ref 3.5–5.2)
Alkaline Phosphatase: 72 U/L (ref 39–117)
BUN: 16 mg/dL (ref 6–23)
CO2: 24 meq/L (ref 19–32)
Calcium: 9 mg/dL (ref 8.4–10.5)
Chloride: 106 meq/L (ref 96–112)
Creatinine, Ser: 0.69 mg/dL (ref 0.40–1.20)
GFR: 98.64 mL/min (ref >60.00)
Glucose, Bld: 95 mg/dL (ref 70–99)
Potassium: 3.5 meq/L (ref 3.5–5.1)
Sodium: 140 meq/L (ref 135–145)
Total Bilirubin: 0.2 mg/dL (ref 0.2–1.2)
Total Protein: 6.7 g/dL (ref 6.0–8.3)

## 2023-09-29 LAB — TROPONIN I (HIGH SENSITIVITY): High Sens Troponin I: 6 ng/L (ref 2–17)

## 2023-09-29 NOTE — Patient Instructions (Addendum)
 Chest Pain and back pain intermittently Intermittent chest pain with increased severity, potential cardiac etiology due to smoking and hyperlipidemia. Emphasized ruling out cardiac causes and need for further evaluation despite normal EKG. - Order troponin test and chest X-ray. -long discussion today. Advised ED now but you declined again advise due to dad at home with dementia and schizophrenia. no one watching him. So will do limited studies today and explained potential risk to life and disability. Some pitfalls with just one set troponin. Can't rule out cardiac cause completely. Also other differential dx. - Advise emergency department evaluation if symptoms worsen or test results are abnormal. - Refer to cardiology for further evaluation. - Ensure phone is charged and volume is up for communication of test results. -plan is to be seen in ED again tomorrow provided labs negative today and clinically stable today. 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 Shortness of breath noted during chest pain episodes.  General Health Maintenance Incomplete screening mammogram due to transportation issues. - Advise to complete the scheduled screening mammogram within a month.   follow up one week pcp or sooner if needed.

## 2023-09-29 NOTE — Progress Notes (Addendum)
 Subjective:    Patient ID: Caitlin Pineda, female    DOB: 09-Dec-1970, 53 y.o.   MRN: [id number]  HPI   Discussed the use of AI scribe software for clinical note transcription with the patient, who gave verbal consent to proceed.  History of Present Illness   Caitlin Pineda is a 53 year old female with COPD and emphysema who presents with chest and back pain.  She has been experiencing chest and back pain for the past two weeks. The pain is located in the middle of her chest, on the edges of her breast, and at the top of her back. Initially infrequent, the pain has become severe over the past three days, preventing sleep. She describes it as 'severe' and notes it was continuous all day and night two days ago, prompting her to call for medical assistance.  Two days ago, an EKG performed by EMS was reported as normal. Despite this, the pain persisted, leading to another EMS visit and a trip to the emergency department at [facility]. However, due to long wait times and personal obligations, she left the hospital without being seen.  The pain is exacerbated by stress, as evidenced by an episode yesterday when she missed a medical appointment, leading to immediate onset of chest, back, and head pain. This pain lasted until she fell asleep last night. This morning, she reports only slight back pain between her scapulae and no chest pain.  She has a history of COPD and emphysema and reports being short of breath during a recent episode at work. She has smoked a pack of cigarettes daily since she was [age] and lives with her father, who smokes five packs a day, making it difficult for her to quit.  She also mentions a past medical history of high cholesterol and a previous recommendation to see a cardiologist for potential beta blocker therapy, which she did not pursue.  During the review of symptoms, she reports a single episode of arm numbness two weeks ago and occasional  palpitations. No recent jaw, shoulder, or arm pain associated with her current chest pain episodes.            The 10-year ASCVD risk score (Arnett DK, et al., 2019) is: 5.1%   Values used to calculate the score:     Age: 32 years     Sex: Female     Is Non-Hispanic African American: No     Diabetic: No     Tobacco smoker: Yes     Systolic Blood Pressure: 144 mmHg     Is BP treated: No     HDL Cholesterol: 60.5 mg/dL     Total Cholesterol: 200 mg/dL       Review of Systems  Constitutional:  Negative for chills, fatigue and fever.  HENT:  Negative for congestion, ear discharge and facial swelling.   Respiratory:  Negative for cough, chest tightness, shortness of breath and wheezing.   Cardiovascular:  Positive for chest pain. Negative for palpitations.       Possible breast pain.  Gastrointestinal:  Negative for abdominal pain.  Genitourinary:  Negative for dyspareunia, enuresis and flank pain.  Musculoskeletal:  Positive for back pain.       See hpi.  Neurological:  Negative for dizziness, seizures and light-headedness.  Hematological:  Negative for adenopathy. Does not bruise/bleed easily.  Psychiatric/Behavioral:  Negative for behavioral problems and decreased concentration.     Past Medical History:  Diagnosis Date  Borderline personality disorder (HCC) 09/03/2015   COPD (chronic obstructive pulmonary disease) (HCC)    Disequilibrium 09/25/2018   HSV-2 infection    2023, [facility], tx with Valtrex   Hypertension    Lumbar radiculopathy 02/10/2017   Formatting of this note might be different from the original. Added automatically from request for surgery [id number]  Formatting of this note might be different from the original. Added automatically from request for surgery [id number]   Major depression, recurrent (HCC) 07/11/2015   Tobacco abuse 09/25/2018   Vulva cancer (HCC)      Social History   Socioeconomic History   Marital status: Single    Spouse  name: Not on file   Number of children: Not on file   Years of education: Not on file   Highest education level: Not on file  Occupational History   Not on file  Tobacco Use   Smoking status: Every Day    Current packs/day: 1.50    Types: Cigarettes   Smokeless tobacco: Never  Substance and Sexual Activity   Alcohol use: Yes    Comment: 1 pint liquor every afternoon   Drug use: Yes    Types: Cocaine   Sexual activity: Not on file  Other Topics Concern   Not on file  Social History Narrative   ** Merged History Encounter **       Social Drivers of Corporate investment banker Strain: Not on file  Food Insecurity: No Food Insecurity (12/24/2020)   Received from [facility], [facility]   Hunger Vital Sign    Worried About Running Out of Food in the Last Year: Never true    Ran Out of Food in the Last Year: Never true  Transportation Needs: Not on file  Physical Activity: Not on file  Stress: Not on file  Social Connections: Unknown (11/22/2021)   Received from [facility], [facility]   Social Network    Social Network: Not on file  Intimate Partner Violence: Unknown (10/14/2021)   Received from [facility], [facility]   HITS    Physically Hurt: Not on file    Insult or Talk Down To: Not on file    Threaten Physical Harm: Not on file    Scream or Curse: Not on file    Past Surgical History:  Procedure Laterality Date   CESAREAN SECTION     laser surgery for vulva cancer     TUBAL LIGATION      Family History  Problem Relation Age of Onset   Hypertension Mother    Hypertension Father    Diabetes Father    COPD Father    Gout Father     Allergies  Allergen Reactions   Prednisone Hives    Current Outpatient Medications on File Prior to Visit  Medication Sig Dispense Refill   diazepam (VALIUM) 5 MG tablet Take one tablet by mouth with food one hour prior to procedure. May repeat 30 minutes prior if needed. (Patient not taking: Reported on  08/08/2023) 2 tablet 0   albuterol (VENTOLIN HFA) 108 (90 Base) MCG/ACT inhaler Inhale 2 puffs into the lungs every 6 (six) hours as needed for wheezing or shortness of breath. 18 g 5   buprenorphine (BUTRANS) 20 MCG/HR PTWK Place 1 patch onto the skin once a week. 4 patch 0   Fluticasone-Umeclidin-Vilant (TRELEGY ELLIPTA) 100-62.5-25 MCG/ACT AEPB Inhale 1 puff into the lungs daily. 60 each 1   HYDROcodone-acetaminophen (NORCO) 5-325 MG tablet Take  1 tablet by mouth every 6 (six) hours as needed for moderate pain (pain score 4-6). 120 tablet 0   methocarbamol (ROBAXIN) 500 MG tablet Take 1 tablet (500 mg total) by mouth 3 (three) times daily. 90 tablet 0   pantoprazole (PROTONIX) 20 MG tablet Take 1 tablet (20 mg total) by mouth daily. (Patient not taking: Reported on 08/08/2023) 90 tablet 3   No current facility-administered medications on file prior to visit.    BP 138/80   Pulse 60   Temp 98 F (36.7 C)   Resp 18   Ht 5\' 4"  (1.626 m)   Wt 118 lb (53.5 kg)   SpO2 100%   BMI 20.25 kg/m         Objective:   Physical Exam  General Mental Status- Alert. General Appearance- Not in acute distress.   Skin General: Color- Normal Color. Moisture- Normal Moisture.  Neck No JVD.  Chest and Lung Exam Auscultation: Breath Sounds:-CTA  Cardiovascular Auscultation:Rythm- RRR Murmurs & Other Heart Sounds:Auscultation of the heart reveals- No Murmurs.  Abdomen Inspection:-Inspeection Normal. Palpation/Percussion:Note:No mass. Palpation and Percussion of the abdomen reveal- Non Tender, Non Distended + BS, no rebound or guarding.   Neurologic Cranial Nerve exam:- CN III-XII intact(No nystagmus), symmetric smile. Strength:- 5/5 equal and symmetric strength both upper and lower extremities.   Lower ext- calfs symmetric. Negative homans signs. No edema.  Anterior thorax- no chest wall pain on palpation.  Back- no mid upper t spine tenderness.     Assessment & Plan:    [phone]  Patient Instructions  Chest Pain and back pain intermittently Intermittent chest pain with increased severity, potential cardiac etiology due to smoking and hyperlipidemia. Emphasized ruling out cardiac causes and need for further evaluation despite normal EKG. - Order troponin test and chest X-ray. -long discussion today. Advised ED now but you declined again advise due to dad at home with dementia and schizophrenia. no one watching him. So will do limited studies today and explained potential risk to life and disability. Some pitfalls with just one set troponin. Can't rule out cardiac cause completely. Also other differential dx. - Advise emergency department evaluation if symptoms worsen or test results are abnormal. - Refer to cardiology for further evaluation. - Ensure phone is charged and volume is up for communication of test results. -plan is to be seen in ED again tomorrow provided labs negative today and clinically stable today. 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 Shortness of breath noted during chest pain episodes.  General Health Maintenance Incomplete screening mammogram due to transportation issues. - Advise to complete the scheduled screening mammogram within a month.   follow up one week pcp or sooner if needed.    Time spent with patient today was 40  minutes which consisted of chart revdiew, discussing diagnosis, work up treatment and documentation. This time was separate from time spent for EKG

## 2023-10-01 LAB — CBC AND DIFFERENTIAL
HCT: 41 (ref 36–46)
Hemoglobin: 13.6 (ref 12.0–16.0)
Platelets: 247 10*3/uL (ref 150–400)
WBC: 8

## 2023-10-01 LAB — BASIC METABOLIC PANEL WITH GFR
BUN: 21 (ref 4–21)
CO2: 22 (ref 13–22)
Chloride: 108 (ref 99–108)
Creatinine: 0.6 (ref 0.5–1.1)
Glucose: 104
Potassium: 3.5 meq/L (ref 3.5–5.1)
Sodium: 140 (ref 137–147)

## 2023-10-01 LAB — HEPATIC FUNCTION PANEL
ALT: 11 U/L (ref 7–35)
AST: 22 (ref 13–35)
Alkaline Phosphatase: 77 (ref 25–125)
Bilirubin, Total: 0.3

## 2023-10-01 LAB — COMPREHENSIVE METABOLIC PANEL WITH GFR
Albumin: 4.2 (ref 3.5–5.0)
Calcium: 9.1 (ref 8.7–10.7)
eGFR: 107

## 2023-10-01 LAB — CBC: RBC: 4.4 (ref 3.87–5.11)

## 2023-10-02 ENCOUNTER — Telehealth: Payer: Self-pay

## 2023-10-02 ENCOUNTER — Ambulatory Visit: Payer: Self-pay | Admitting: Family Medicine

## 2023-10-02 NOTE — Telephone Encounter (Signed)
 Initial Comment Caller states she has been having chest pain and pain in her back that has gotten worse. Translation No Nurse Assessment Nurse: Caitlin Lor, RN, Caitlin Pineda Date/Time Caitlin Pineda Time): 10/01/2023 3:15:49 PM Confirm and document reason for call. If symptomatic, describe symptoms. ---Caller states onset CP and back pain for about 2 weeks. Was seen in the office, had labs ordered and to go to ER for second part of the draw. F/u appt is Wednesday - asking for Rx. Does the patient have any new or worsening symptoms? ---Yes Will a triage be completed? ---Yes Related visit to physician within the last 2 weeks? ---Yes Does the PT have any chronic conditions? (i.e. diabetes, asthma, this includes High risk factors for pregnancy, etc.) ---Yes List chronic conditions. ---pre-DM / COPD / emphysema / DJD back / arthritis / neuropathy Is the patient pregnant or possibly pregnant? (Ask all females between the ages of 9-55) ---No Is this a behavioral health or substance abuse call? ---No Guidelines Guideline Title Affirmed Question Affirmed Notes Nurse Date/Time (Eastern Time) Chest Pain Difficult to awaken or acting confused (e.g., disoriented, slurred speech) Caitlin Lor, RN, Caitlin Pineda 10/01/2023 3:18:34 PM PLEASE NOTE: All timestamps contained within this report are represented as [place] Standard Time. CONFIDENTIALTY NOTICE: This fax transmission is intended only for the addressee. It contains information that is legally privileged, confidential or otherwise protected from use or disclosure. If you are not the intended recipient, you are strictly prohibited from reviewing, disclosing, copying using or disseminating any of this information or taking any action in reliance on or regarding this information. If you have received this fax in error, please notify us immediately by telephone so that we can arrange for its return to [place]. Phone: [phone], Toll-Free: [phone], Fax:  [phone] Caitlin Pineda Nov 19, 1970 Page: 2 of 2 CallId: [id number] Disp. Time Caitlin Pineda Time) Disposition Final User 10/01/2023 3:14:12 PM Send to Urgent Caitlin Pineda 10/01/2023 3:20:06 PM Call EMS 911 Now Yes Caitlin Pineda 10/01/2023 3:22:32 PM 911 Outcome Documentation Caitlin Lor, RN, Caitlin Pineda Reason: EMS refused Final Disposition 10/01/2023 3:20:06 PM Call EMS 911 Now Yes Caitlin Lor, RN, Caitlin Pineda Disagree/Comply Disagree Caller Understands Yes PreDisposition Call Doctor Care Advice Given Per Guideline CALL EMS 911 NOW: * Immediate medical attention is needed. You need to hang up and call 911 (or an ambulance). * Triager Discretion: I'll call you back in a few minutes to be sure you were able to reach them. CARE ADVICE given per Chest Pain (Adult) guideline. Comments User: Caitlin Hane, RN Date/Time Caitlin Pineda Time): 10/01/2023 3:22:08 PM Can't leave Dad Has had several episodes of confusion last week - left the burner turned on on the stove, "messed up several times at work, I'm just not thinking straight

## 2023-10-02 NOTE — Addendum Note (Signed)
 Addended by: Gwenevere Abbot on: 10/02/2023 05:26 PM   Modules accepted: Orders

## 2023-10-02 NOTE — Telephone Encounter (Signed)
 See note from Corazin. Patient called back.

## 2023-10-02 NOTE — Telephone Encounter (Signed)
 Patient called back after call dropped this morning. Patient inquired about getting the "second part of her lab work done". Patient stated she is still experiencing chest pain and elevated BP. Patient stated systolic BP was 165 this morning. Patient stated that she was advised by the office to go to the ED. Message in chart reflects this on 09/29/23. Patient stated she attempted to go to the ED over the weekend, but left because the wait was too long. Per chart,  Esperanza Richters recommended patient follow through with ED evaluation today. This RN advised ED and advised that she could follow-up in the office after ED evaluation. Patient stated that she did not want to go to the ED. Before this RN could continue triage or transfer to CAL, patient hung up. This RN notified CAL.  Copied from CRM [id number]. Topic: Clinical - Red Word Triage >> Oct 02, 2023  9:28 AM [place] A wrote: [facility] that prompted transfer to Nurse Triage: Patient is having Chest Pain and High Blood Pressure and Back Pain-pain level is about 7 or 8

## 2023-10-02 NOTE — Telephone Encounter (Signed)
 Caitlin Pineda called and spoke with patient this morning.

## 2023-10-02 NOTE — Telephone Encounter (Signed)
 Pt called back and was discussing about not able to get blood work done. Then the call dropped. Attempted to call pt back but did not get anyone to pick up.   Per Telephone note, pt was to go to ED this am as recommended by Esperanza Richters. Called CAL line and advised that the pt called back per the agent < for medication refills. " Reminded the pt that she was to go to ED. Pt began talking why she didn't get her BW done then the phone call dropped. Attempted to call pt back and unable to get answer. Advised office that status that pt is calling back.

## 2023-10-02 NOTE — Telephone Encounter (Signed)
 Caitlin Pineda out of the office, saw [name] Friday. Please advise.

## 2023-10-02 NOTE — Telephone Encounter (Signed)
 Patient reports she was seen at [facility] yesterday, however, she did not get a full evaluation due to "waiting for over 4 hours" Release of information form faxed to [facility], requesting labs and any other results from yesterday.  Patient reports her car broke down and she is unable to get to [place] for evaluation at ED, patient was strongly advise to have complete evaluation at ED, se will "try to get to [facility], does not want to go back to [facility]" Patient waiting for Cardiology referral.

## 2023-10-03 NOTE — Telephone Encounter (Signed)
 Patient notified of referral and given cardiology's phone number

## 2023-10-04 ENCOUNTER — Emergency Department (HOSPITAL_COMMUNITY)
Admission: EM | Admit: 2023-10-04 | Discharge: 2023-10-04 | Discharge status: Against Medical Advice | Attending: Emergency Medicine | Admitting: Emergency Medicine

## 2023-10-04 ENCOUNTER — Emergency Department (HOSPITAL_COMMUNITY)

## 2023-10-04 ENCOUNTER — Encounter (HOSPITAL_COMMUNITY): Payer: Self-pay | Admitting: Emergency Medicine

## 2023-10-04 ENCOUNTER — Other Ambulatory Visit: Payer: Self-pay

## 2023-10-04 DIAGNOSIS — Z5321 Procedure and treatment not carried out due to patient leaving prior to being seen by health care provider: Secondary | ICD-10-CM | POA: Diagnosis not present

## 2023-10-04 DIAGNOSIS — R0602 Shortness of breath: Secondary | ICD-10-CM | POA: Insufficient documentation

## 2023-10-04 DIAGNOSIS — R079 Chest pain, unspecified: Secondary | ICD-10-CM | POA: Insufficient documentation

## 2023-10-04 LAB — TROPONIN I (HIGH SENSITIVITY)
Troponin I (High Sensitivity): 4 ng/L (ref <18)
Troponin I (High Sensitivity): 4 ng/L (ref <18)

## 2023-10-04 LAB — CBC
HCT: 47 % — ABNORMAL HIGH (ref 36.0–46.0)
Hemoglobin: 14.8 g/dL (ref 12.0–15.0)
MCH: 30.1 pg (ref 26.0–34.0)
MCHC: 31.5 g/dL (ref 30.0–36.0)
MCV: 95.5 fL (ref 80.0–100.0)
Platelets: 330 10*3/uL (ref 150–400)
RBC: 4.92 MIL/uL (ref 3.87–5.11)
RDW: 13.8 % (ref 11.5–15.5)
WBC: 8.5 10*3/uL (ref 4.0–10.5)
nRBC: 0 % (ref 0.0–0.2)

## 2023-10-04 LAB — BASIC METABOLIC PANEL
Anion gap: 14 (ref 5–15)
BUN: 19 mg/dL (ref 6–20)
CO2: 17 mmol/L — ABNORMAL LOW (ref 22–32)
Calcium: 8.8 mg/dL — ABNORMAL LOW (ref 8.9–10.3)
Chloride: 111 mmol/L (ref 98–111)
Creatinine, Ser: 0.86 mg/dL (ref 0.44–1.00)
GFR, Estimated: >60 mL/min (ref >60)
Glucose, Bld: 88 mg/dL (ref 70–99)
Potassium: 4.8 mmol/L (ref 3.5–5.1)
Sodium: 142 mmol/L (ref 135–145)

## 2023-10-04 LAB — HCG, SERUM, QUALITATIVE: Preg, Serum: NEGATIVE

## 2023-10-04 NOTE — ED Notes (Signed)
 Pt was called for room 3x but received no response

## 2023-10-04 NOTE — ED Triage Notes (Addendum)
 Patient complaining of centralized sharp chest pain that radiates to back for 2 weeks. Reports shortness of breath - worse in the morning. Patient also reports she has been more forgetful this week.

## 2023-10-05 ENCOUNTER — Encounter: Payer: Self-pay | Admitting: Medical

## 2023-10-05 ENCOUNTER — Telehealth: Payer: Self-pay

## 2023-10-05 NOTE — Telephone Encounter (Signed)
 Copied from CRM [id number]. Topic: General - Other >> Oct 05, 2023  2:43 PM Truddie Crumble wrote: Reason for CRM: patient called stating she saw Esperanza Richters on last Friday and he wanted her to go to the hospital. Patient she went last night and her results are in [place]. Patient stated that they ran blood test  and other test on her and her CO2 was low. Patient want to see if [facility] can review the visit because she need medication. Patient stated she has a cardiologist appointment on 4/14. I was going to triage the patient but she stated she has already been to the hospital. Patient would like to be called   CB [phone]

## 2023-10-05 NOTE — Telephone Encounter (Signed)
 Please advise pt left ER without being seen

## 2023-10-05 NOTE — Addendum Note (Signed)
 Addended by: Gwenevere Abbot on: 10/05/2023 06:59 PM   Modules accepted: Orders

## 2023-10-06 ENCOUNTER — Encounter: Payer: Self-pay | Admitting: Neurology

## 2023-11-02 ENCOUNTER — Encounter: Payer: Self-pay | Admitting: Family Medicine

## 2023-11-02 ENCOUNTER — Ambulatory Visit: Admitting: Family Medicine

## 2023-11-02 VITALS — BP 109/75 | HR 67 | Ht 64.0 in | Wt 115.0 lb

## 2023-11-02 DIAGNOSIS — R5383 Other fatigue: Secondary | ICD-10-CM

## 2023-11-02 DIAGNOSIS — G629 Polyneuropathy, unspecified: Secondary | ICD-10-CM

## 2023-11-02 DIAGNOSIS — R0789 Other chest pain: Secondary | ICD-10-CM

## 2023-11-02 LAB — CBC WITH DIFFERENTIAL/PLATELET
Basophils Absolute: 0 10*3/uL (ref 0.0–0.1)
Basophils Relative: 0.4 % (ref 0.0–3.0)
Eosinophils Absolute: 0.1 10*3/uL (ref 0.0–0.7)
Eosinophils Relative: 2 % (ref 0.0–5.0)
HCT: 43.7 % (ref 36.0–46.0)
Hemoglobin: 14.2 g/dL (ref 12.0–15.0)
Lymphocytes Relative: 26.3 % (ref 12.0–46.0)
Lymphs Abs: 1.3 10*3/uL (ref 0.7–4.0)
MCHC: 32.5 g/dL (ref 30.0–36.0)
MCV: 96 fl (ref 78.0–100.0)
Monocytes Absolute: 0.3 10*3/uL (ref 0.1–1.0)
Monocytes Relative: 5.3 % (ref 3.0–12.0)
Neutro Abs: 3.2 10*3/uL (ref 1.4–7.7)
Neutrophils Relative %: 66 % (ref 43.0–77.0)
Platelets: 229 10*3/uL (ref 150.0–400.0)
RBC: 4.55 Mil/uL (ref 3.87–5.11)
RDW: 16 % — ABNORMAL HIGH (ref 11.5–15.5)
WBC: 4.9 10*3/uL (ref 4.0–10.5)

## 2023-11-02 LAB — COMPREHENSIVE METABOLIC PANEL WITH GFR
ALT: 10 U/L (ref 0–35)
AST: 18 U/L (ref 0–37)
Albumin: 4.3 g/dL (ref 3.5–5.2)
Alkaline Phosphatase: 64 U/L (ref 39–117)
BUN: 24 mg/dL — ABNORMAL HIGH (ref 6–23)
CO2: 22 meq/L (ref 19–32)
Calcium: 8.7 mg/dL (ref 8.4–10.5)
Chloride: 109 meq/L (ref 96–112)
Creatinine, Ser: 0.77 mg/dL (ref 0.40–1.20)
GFR: 87.62 mL/min (ref >60.00)
Glucose, Bld: 83 mg/dL (ref 70–99)
Potassium: 4.7 meq/L (ref 3.5–5.1)
Sodium: 140 meq/L (ref 135–145)
Total Bilirubin: 0.2 mg/dL (ref 0.2–1.2)
Total Protein: 6.7 g/dL (ref 6.0–8.3)

## 2023-11-02 LAB — TSH: TSH: 0.87 u[IU]/mL (ref 0.35–5.50)

## 2023-11-02 LAB — IBC + FERRITIN
Ferritin: 13.3 ng/mL (ref 10.0–291.0)
Iron: 112 ug/dL (ref 42–145)
Saturation Ratios: 27 % (ref 20.0–50.0)
TIBC: 414.4 ug/dL (ref 250.0–450.0)
Transferrin: 296 mg/dL (ref 212.0–360.0)

## 2023-11-02 LAB — VITAMIN D 25 HYDROXY (VIT D DEFICIENCY, FRACTURES): VITD: 20.67 ng/mL — ABNORMAL LOW (ref 30.00–100.00)

## 2023-11-02 LAB — B12 AND FOLATE PANEL
Folate: 6.3 ng/mL (ref >5.9)
Vitamin B-12: 281 pg/mL (ref 211–911)

## 2023-11-02 MED ORDER — GABAPENTIN 300 MG PO CAPS: 300.0000 mg | ORAL_CAPSULE | Freq: Every day | ORAL | 3 refills | Status: DC

## 2023-11-02 NOTE — Progress Notes (Signed)
 Acute Office Visit  Subjective:     Patient ID: Caitlin Pineda, female    DOB: 1971/06/17, 53 y.o.   MRN: [id number]  Chief Complaint  Patient presents with   Fatigue    HPI Patient is in today for fatigue.   Discussed the use of AI scribe software for clinical note transcription with the patient, who gave verbal consent to proceed.  History of Present Illness Caitlin Pineda is a 53 year old female who presents with fatigue and weakness.  She has been experiencing extreme fatigue and weakness for the past two weeks, with dark, baggy circles under her eyes that worsen throughout the day. She feels weak and tired at work and struggles to get through the day. No fever, cough, sneezing, or new chest pain symptoms, although she continues to experience chest pain - outpatient and ED workup stable so far, scheduled to see cardiology in a few weeks. Patient aware of signs/symptoms requiring further/urgent evaluation.   She has a cardiology appointment scheduled for May 14th. Previous workup included a normal EKG and troponin levels. A chest x-ray was normal other than emphysema - history of smoking.  She has an upcoming appointment on May 8th for medication management at [facility], where she seeks help for escalating home stress and anxiety.  She experiences neuropathy in her feet, previously managed with gabapentin , which she found intolerable at the prescribed dose of three times a day due to side effects. She requests to restart gabapentin  at a lower dose of 300 mg once a day as she has tolerated that fine.  Her social situation is challenging, as she lives with her father who smokes five packs a day and has dementia and schizophrenia, leading to frequent conflicts and her having to live in her car at times. She also has three dogs that disrupt her sleep. She notes swelling in her ankles, particularly after standing all day at work.          ROS All review of systems negative  except what is listed in the HPI      Objective:    BP 109/75   Pulse 67   Ht 5\' 4"  (1.626 m)   Wt 115 lb (52.2 kg)   SpO2 98%   BMI 19.74 kg/m    Physical Exam Vitals reviewed.  Constitutional:      Appearance: Normal appearance.  Cardiovascular:     Rate and Rhythm: Normal rate and regular rhythm.  Pulmonary:     Effort: Pulmonary effort is normal.     Breath sounds: Normal breath sounds.  Musculoskeletal:     Right lower leg: No edema.     Left lower leg: No edema.  Skin:    General: Skin is warm and dry.  Neurological:     Mental Status: She is alert and oriented to person, place, and time.  Psychiatric:        Mood and Affect: Mood normal.        Behavior: Behavior normal.        Thought Content: Thought content normal.        Judgment: Judgment normal.     No results found for any visits on 11/02/23.      Assessment & Plan:   Problem List Items Addressed This Visit   None Visit Diagnoses       Fatigue, unspecified type    -  Primary Persistent fatigue and weakness with possible anemia, vitamin deficiencies, thyroid  dysfunction, or stress. -  Order labs today - Advise on sleep hygiene and stress management.   Relevant Orders   CBC with Differential/Platelet   Comprehensive metabolic panel with GFR   B12 and Folate Panel   IBC + Ferritin   TSH     Hypocalcemia       Relevant Orders   Comprehensive metabolic panel with GFR   VITAMIN D  25 Hydroxy (Vit-D Deficiency, Fractures)     Peripheral polyneuropathy     - Prescribe gabapentin  300 mg once daily as this has worked in the past.   Relevant Medications   gabapentin  (NEURONTIN ) 300 MG capsule     Chest discomfort     Intermittent chest pain with recent normal EKG and troponin. No acute pain today. Differential includes cardiac and pulmonary causes. COPD and secondhand smoke may contribute. Stress/anxiety can also be a factor - keep [facility] appointment.  - Keep cardiology appointment on May  14th. - Advise emergency department visit if pain worsens or is accompanied by nausea or sweating. - Encourage setting up an appointment with pulmonologist.          Meds ordered this encounter  Medications   gabapentin  (NEURONTIN ) 300 MG capsule    Sig: Take 1 capsule (300 mg total) by mouth daily.    Dispense:  30 capsule    Refill:  3    Supervising Provider:   Randie Bustle A [[id number]]    Return if symptoms worsen or fail to improve.  Caitlin Hock, NP

## 2023-11-07 ENCOUNTER — Encounter
Payer: Medicaid Other | Attending: Physical Medicine and Rehabilitation | Admitting: Physical Medicine and Rehabilitation

## 2023-11-07 VITALS — BP 111/76 | HR 68 | Ht 64.0 in | Wt 117.0 lb

## 2023-11-07 DIAGNOSIS — R6889 Other general symptoms and signs: Secondary | ICD-10-CM | POA: Insufficient documentation

## 2023-11-07 DIAGNOSIS — F1411 Cocaine abuse, in remission: Secondary | ICD-10-CM | POA: Diagnosis present

## 2023-11-07 DIAGNOSIS — E1142 Type 2 diabetes mellitus with diabetic polyneuropathy: Secondary | ICD-10-CM | POA: Insufficient documentation

## 2023-11-07 DIAGNOSIS — Z636 Dependent relative needing care at home: Secondary | ICD-10-CM | POA: Insufficient documentation

## 2023-11-07 MED ORDER — CAPSAICIN-CLEANSING GEL 8 % EX KIT
2.0000 | PACK | Freq: Once | CUTANEOUS | Status: AC
  Administered 2023-11-07: 2 via TOPICAL

## 2023-11-07 MED ORDER — JOURNAVX 50 MG PO TABS: 1.0000 | ORAL_TABLET | Freq: Two times a day (BID) | ORAL | 0 refills | Status: DC | PRN

## 2023-11-07 MED ORDER — TOPIRAMATE 25 MG PO TABS: 25.0000 mg | ORAL_TABLET | Freq: Two times a day (BID) | ORAL | 3 refills | Status: DC

## 2023-11-07 NOTE — Addendum Note (Signed)
 Addended by: Avelina Bode T on: 11/07/2023 11:42 AM   Modules accepted: Orders

## 2023-11-07 NOTE — Patient Instructions (Signed)
 Insomnia: -Try to go outside near sunrise -Get exercise during the day.  -Turn off all devices an hour before bedtime.  -Teas that can benefit: chamomile, valerian root, Brahmi (Bacopa) -Can consider over the counter melatonin, magnesium, and/or L-theanine. Melatonin is an anti-oxidant with multiple health benefits. Magnesium is involved in greater than 300 enzymatic reactions in the body and most of [place] are deficient as our soil is often depleted. There are 7 different types of magnesium- Bioptemizer's is a supplement with all 7 types, and each has unique benefits. Magnesium can also help with constipation and anxiety.  -Pistachios naturally increase the production of melatonin -Cozy Earth bamboo bed sheets are free from toxic chemicals.  -Tart cherry juice or a tart cherry supplement can improve sleep and soreness post-workout    Foods that may reduce pain: 1) Ginger (especially studied for arthritis)- reduce leukotriene production to decrease inflammation 2) Blueberries- high in phytonutrients that decrease inflammation 3) Salmon- marine omega-3s reduce joint swelling and pain 4) Pumpkin seeds- reduce inflammation 5) dark chocolate- reduces inflammation 6) turmeric- reduces inflammation 7) tart cherries - reduce pain and stiffness 8) extra virgin olive oil - its compound olecanthal helps to block prostaglandins  9) chili peppers- can be eaten or applied topically via capsaicin 10) mint- helpful for headache, muscle aches, joint pain, and itching 11) garlic- reduces inflammation 12) Green tea- reduces inflammation and oxidative stress, helps with weight loss, may reduce the risk of cancer, recommend Double Green Matcha [place] of Tea daily  Link to further information on diet for chronic pain: [email or web address].[email or web address]

## 2023-11-07 NOTE — Progress Notes (Signed)
 Subjective:    Patient ID: Caitlin Pineda, female    DOB: 01-Jul-1971, 53 y.o.   MRN: [id number]  HPI Mrs. 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 General activity 10 Relation with others 0 Enjoyment of life 10 What TIME of day is your pain at its worst? morning , daytime, evening, and night Sleep (in general) Poor  Pain is worse with: walking, standing, and some activites Pain improves with: rest, heat/ice, medication, and cratom Relief from Meds: 7  how many minutes can you walk? 5-10 ability to climb steps?  yes do you drive?  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  weakness trouble walking depression  New pt  New pt    Family History  Problem Relation Age of Onset   Hypertension Mother    Hypertension Father    Diabetes Father    COPD Father    Gout Father    Social History   Socioeconomic  History   Marital status: Single    Spouse name: Not on file   Number of children: Not on file   Years of education: Not on file   Highest education level: Not on file  Occupational History   Not on file  Tobacco Use   Smoking status: Every Day    Current packs/day: 1.50    Types: Cigarettes   Smokeless tobacco: Never  Substance and Sexual Activity   Alcohol use: Yes    Comment: 1 pint liquor every afternoon   Drug use: Yes    Types: Cocaine   Sexual activity: Not on file  Other Topics Concern   Not on file  Social History Narrative   ** Merged History Encounter **       Social Drivers of Corporate investment banker Strain: Not on file  Food Insecurity: No Food Insecurity (12/24/2020)   Received from [facility], [facility]   Hunger Vital Sign    Worried About Running Out of Food in the Last Year: Never true    Ran Out of Food in the Last Year: Never true  Transportation Needs: Not on file  Physical Activity: Not on file  Stress: Not on file  Social Connections: Unknown (11/22/2021)   Received from [facility], [facility]   Social Network    Social Network: Not  on file   Past Surgical History:  Procedure Laterality Date   CESAREAN SECTION     laser surgery for vulva cancer     TUBAL LIGATION     Past Medical History:  Diagnosis Date   Borderline personality disorder (HCC) 09/03/2015   COPD (chronic obstructive pulmonary disease) (HCC)    Disequilibrium 09/25/2018   HSV-2 infection    2023, [facility], tx with Valtrex   Hypertension    Lumbar radiculopathy 02/10/2017   Formatting of this note might be different from the original. Added automatically from request for surgery [id number]  Formatting of this note might be different from the original. Added automatically from request for surgery [id number]   Major depression, recurrent (HCC) 07/11/2015   Tobacco abuse 09/25/2018   Vulva cancer (HCC)    BP 111/76   Pulse 68   Ht 5\' 4"  (1.626 m)   Wt  117 lb (53.1 kg)   SpO2 92%   BMI 20.08 kg/m   Opioid Risk Score:   Fall Risk Score:  `1  Depression screen [facility] 2/9     11/02/2023    8:35 AM 08/08/2023    9:24 AM  Depression screen PHQ 2/9  Decreased Interest 3 3  Down, Depressed, Hopeless 3 2  PHQ - 2 Score 6 5  Altered sleeping 3 2  Tired, decreased energy 3 2  Change in appetite 3 0  Feeling bad or failure about yourself  3 2  Trouble concentrating 3 0  Moving slowly or fidgety/restless 0 0  Suicidal thoughts 3 0  PHQ-9 Score 24 11  Difficult doing work/chores Extremely dIfficult Not difficult at all     Review of Systems  Musculoskeletal:  Positive for back pain.       Right hip pain  Neurological:  Positive for weakness.  All other systems reviewed and are negative.      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   -Discussed Qutenza as an option for neuropathic pain control. Discussed that this is a capsaicin patch, stronger than capsaicin cream. Discussed that it is currently approved for diabetic peripheral neuropathy and post-herpetic neuralgia, but that it has also shown benefit in treating other forms of neuropathy. Provided patient with link to site to learn more about the patch: https://www.clark.biz/. Discussed that the  patch would be placed in office and benefits usually last 3 months. Discussed that unintended exposure to capsaicin can cause severe irritation of eyes, mucous membranes, respiratory tract, and skin, but that Qutenza is a local treatment and does not have the systemic side effects of other nerve medications. Discussed that there may be pain, itching, erythema, and decreased sensory function associated with the application of Qutenza. Side effects usually subside within 1 week. A cold pack of analgesic medications can help with these side effects.  Blood pressure can also be increased due to pain associated with administration of the patch.   4 patches of Qutenza (64640) was applied to the area of pain. Ice packs were applied during the procedure to ensure patient comfort. Blood pressure was monitored every 15 minutes. The patient tolerated the procedure well. Post-procedure instructions were given and follow-up has been scheduled.  Topical system measures 14cm x20cm (280cm for a total 1120units) were applied which will cause deeper penetration for destruction of the peripheral nerve using a chemical (Qutenza) which infuses into the skin like an injection and heat technique (occlusive, compressive dressing cauing endothermic heat technique)   -Discussed current symptoms of pain and history of pain.  -Discussed benefits of exercise in reducing pain. -Discussed following foods that may reduce pain: 1) Ginger (especially studied for arthritis)- reduce leukotriene production to decrease inflammation 2) Blueberries- high in phytonutrients that decrease inflammation 3) Salmon- marine omega-3s reduce joint swelling and pain 4) Pumpkin seeds- reduce inflammation 5) dark chocolate- reduces inflammation 6) turmeric- reduces inflammation 7) tart cherries - reduce pain and stiffness 8) extra virgin olive oil - its compound olecanthal helps to block prostaglandins  9) chili peppers- can be eaten or applied topically  via capsaicin 10) mint- helpful for headache, muscle aches, joint pain, and itching 11) garlic- reduces inflammation 12) Green tea- reduces inflammation and oxidative stress, helps with weight loss, may reduce the risk of cancer, recommend Double Green Matcha [place] of Tea daily  Link to further information on diet for chronic pain: http://www.bray.com/   2) Pruritus from Butrans  patch: -advised applying sarna lotion or coconut oil  3) Insomnia: Discussed topamax at night if Qutenza does not provide relief -Try to go outside near sunrise -Get exercise during the day.  -Turn off all devices an hour before bedtime.  -Teas that can benefit: chamomile, valerian root, Brahmi (Bacopa) -Can consider over the counter melatonin, magnesium, and/or L-theanine. Melatonin is an anti-oxidant with multiple health benefits. Magnesium is involved in greater than 300 enzymatic reactions in the body and most of us  are deficient as our soil is often depleted. There are 7 different types of magnesium- Bioptemizer's is a supplement with all 7 types, and each has unique benefits. Magnesium can also help with constipation and anxiety.  -Pistachios naturally increase the production of melatonin -Cozy Earth bamboo bed sheets are free from toxic chemicals.  -Tart cherry juice or a tart cherry supplement can improve sleep and soreness post-workout    4) Caregiver stress: -discussed her stress from caring for her father with dementia  5) Cocaine use in remission: -commended her for stopping cocaine use -discussed that we cannot prescribe hydrocodone  or Butrans  for her given this past history -offered referral to [facility]  6) Diabetic peripheral neuropathy: -Discussed Qutenza as an option for neuropathic pain control. Discussed that this is a capsaicin patch, stronger than capsaicin cream. Discussed that it is currently approved  for diabetic peripheral neuropathy and post-herpetic neuralgia, but that it has also shown benefit in treating other forms of neuropathy. Provided patient with link to site to learn more about the patch: https://www.clark.biz/. Discussed that the patch would be placed in office and benefits usually last 3 months. Discussed that unintended exposure to capsaicin can cause severe irritation of eyes, mucous membranes, respiratory tract, and skin, but that Qutenza is a local treatment and does not have the systemic side effects of other nerve medications. Discussed that there may be pain, itching, erythema, and decreased sensory function associated  with the application of Qutenza. Side effects usually subside within 1 week. A cold pack of analgesic medications can help with these side effects. Blood pressure can also be increased due to pain associated with administration of the patch.

## 2023-11-07 NOTE — Addendum Note (Signed)
 Addended by: Liam Redhead on: 11/07/2023 10:36 AM   Modules accepted: Orders

## 2023-11-21 ENCOUNTER — Ambulatory Visit: Attending: Cardiology | Admitting: Cardiology

## 2023-12-05 ENCOUNTER — Ambulatory Visit: Payer: Medicaid Other | Admitting: Physical Medicine and Rehabilitation

## 2024-02-06 ENCOUNTER — Encounter: Attending: Physical Medicine and Rehabilitation | Admitting: Physical Medicine and Rehabilitation

## 2024-02-06 DIAGNOSIS — R6889 Other general symptoms and signs: Secondary | ICD-10-CM | POA: Insufficient documentation

## 2024-02-06 DIAGNOSIS — Z636 Dependent relative needing care at home: Secondary | ICD-10-CM | POA: Insufficient documentation

## 2024-02-06 DIAGNOSIS — F1411 Cocaine abuse, in remission: Secondary | ICD-10-CM | POA: Insufficient documentation

## 2024-02-06 DIAGNOSIS — E1142 Type 2 diabetes mellitus with diabetic polyneuropathy: Secondary | ICD-10-CM | POA: Insufficient documentation

## 2024-02-12 ENCOUNTER — Ambulatory Visit: Payer: Self-pay | Admitting: *Deleted

## 2024-02-12 ENCOUNTER — Ambulatory Visit: Admitting: Family Medicine

## 2024-02-12 ENCOUNTER — Encounter: Payer: Self-pay | Admitting: Family Medicine

## 2024-02-12 VITALS — BP 140/88 | HR 70 | Ht 64.0 in | Wt 124.0 lb

## 2024-02-12 DIAGNOSIS — R079 Chest pain, unspecified: Secondary | ICD-10-CM

## 2024-02-12 DIAGNOSIS — G629 Polyneuropathy, unspecified: Secondary | ICD-10-CM

## 2024-02-12 DIAGNOSIS — I10 Essential (primary) hypertension: Secondary | ICD-10-CM | POA: Diagnosis not present

## 2024-02-12 LAB — EKG 12-LEAD

## 2024-02-12 LAB — TROPONIN I: Troponin I: 3 ng/L (ref <=47)

## 2024-02-12 MED ORDER — GABAPENTIN 300 MG PO CAPS: 300.0000 mg | ORAL_CAPSULE | Freq: Every day | ORAL | 1 refills | Status: DC

## 2024-02-12 MED ORDER — LOSARTAN POTASSIUM 25 MG PO TABS: 25.0000 mg | ORAL_TABLET | Freq: Every day | ORAL | 1 refills | Status: DC

## 2024-02-12 NOTE — Progress Notes (Signed)
 Caitlin Pineda [address], [address] [place], [place] [address] [phone]  Date:  02/12/2024   Name:  Caitlin Pineda   DOB:  05-20-1971   MRN:  [id number]  PCP:  Caitlin Waddell NOVAK, NP    Chief Complaint: Hypertension   History of Present Illness:  Caitlin Pineda is a 53 y.o. very pleasant female patient who presents with the following:  Pt seen today with concern of elevated BP- she does not typically have HTN  She has noted elevated BP at home; she is having a COPD exacerbation and was at UC over the weekend, they checked her BP and it was elevated at 150/something She had to go directly to work after the UC visit - the nurse at work cheked her blood pressure there and it was 150/101 She notes her home BP has been elevated as well the last few days.  Running about the same, 150/90-100 There is a family history of HTN  She does not generally check her home BP She is not on any BP meds   She actually just started her prednisone-she took 1 pill, resume 20 mg last night  Pt notes she has felt dizzy for a month or so She is taking some kratum for pain control which she thought might be making her dizzy-however now she wonders if this is due to her blood pressure  She did have CP in the past and they did an EKG for her back in March She notes a brief episode of CP yesterday for about 15 minutes-occurred while at rest and is now resolved Looking back, she has occasionally had episodes of chest pain over the last several months.  She was referred to cardiology earlier this year but did not end up being seen.  She would be willing to move forward with this referral, wonders if she could be seen in [place]   BP Readings from Last 3 Encounters:  02/12/24 (!) 140/88  11/07/23 111/76  11/02/23 109/75     Patient Active Problem List   Diagnosis Date Noted   Hyperglycemia 04/13/2023   Gastroesophageal reflux disease  04/13/2023   Chronic cough 04/13/2023   Vulva cancer (HCC)    Hypertension    COPD (chronic obstructive pulmonary disease) (HCC)    Disequilibrium 09/25/2018   Tobacco abuse 09/25/2018   Lumbar radiculopathy 02/10/2017   Borderline personality disorder (HCC) 09/03/2015   Major depression, recurrent (HCC) 07/11/2015    Past Medical History:  Diagnosis Date   Borderline personality disorder (HCC) 09/03/2015   COPD (chronic obstructive pulmonary disease) (HCC)    Disequilibrium 09/25/2018   HSV-2 infection    2023, [facility], tx with Valtrex   Hypertension    Lumbar radiculopathy 02/10/2017   Formatting of this note might be different from the original. Added automatically from request for surgery [id number]  Formatting of this note might be different from the original. Added automatically from request for surgery [id number]   Major depression, recurrent (HCC) 07/11/2015   Tobacco abuse 09/25/2018   Vulva cancer (HCC)     Past Surgical History:  Procedure Laterality Date   CESAREAN SECTION     laser surgery for vulva cancer     TUBAL LIGATION      Social History   Tobacco Use   Smoking status: Every Day    Current packs/day: 1.50    Types: Cigarettes   Smokeless tobacco: Never  Substance Use  Topics   Alcohol use: Yes    Comment: 1 pint liquor every afternoon   Drug use: Yes    Types: Cocaine    Family History  Problem Relation Age of Onset   Hypertension Mother    Hypertension Father    Diabetes Father    COPD Father    Gout Father     Allergies  Allergen Reactions   Prednisone Hives    Medication list has been reviewed and updated.  Current Outpatient Medications on File Prior to Visit  Medication Sig Dispense Refill   albuterol  (VENTOLIN  HFA) 108 (90 Base) MCG/ACT inhaler Inhale 2 puffs into the lungs every 6 (six) hours as needed for wheezing or shortness of breath. 18 g 5   cefdinir (OMNICEF) 300 MG capsule Take 300 mg by mouth 2 (two) times  daily.     Fluticasone-Umeclidin-Vilant (TRELEGY ELLIPTA ) 100-62.5-25 MCG/ACT AEPB Inhale 1 puff into the lungs daily. 60 each 1   HYDROcodone -acetaminophen  (NORCO) 10-325 MG tablet Take 1 tablet by mouth every 4 (four) hours as needed.     ibuprofen  (ADVIL ) 800 MG tablet Take 800 mg by mouth 3 (three) times daily as needed.     methocarbamol  (ROBAXIN ) 500 MG tablet Take 1 tablet (500 mg total) by mouth 3 (three) times daily. 90 tablet 0   naloxone (NARCAN) nasal spray 4 mg/0.1 mL SMARTSIG:Spray(s) Both Nares     predniSONE (DELTASONE) 20 MG tablet Take 20 mg by mouth 2 (two) times daily.     No current facility-administered medications on file prior to visit.    Review of Systems:  As per HPI- otherwise negative.   Physical Examination: Vitals:   02/12/24 1538  BP: (!) 140/88  Pulse: 70  SpO2: 93%   Vitals:   02/12/24 1538  Weight: 124 lb (56.2 kg)  Height: 5' 4 (1.626 m)   Body mass index is 21.28 kg/m. Ideal Body Weight: Weight in (lb) to have BMI = 25: 145.3  GEN: no acute distress.  Normal weight, looks well HEENT: Atraumatic, Normocephalic.  Ears and Nose: No external deformity. CV: RRR, No M/G/R. No JVD. No thrill. No extra heart sounds. PULM: CTA B, no wheezes, crackles, rhonchi. No retractions. No resp. distress. No accessory muscle use. ABD: S, NT, ND, +BS. No rebound. No HSM. EXTR: No c/c/e PSYCH: Normally interactive. Conversant.    EKG: SR, no concerning ST changes Compared with EKG from March I do not note any significant or concerning changes   Assessment and Plan: Hypertension, unspecified type - Plan: EKG 12-Lead, losartan  (COZAAR ) 25 MG tablet, Basic metabolic panel with GFR, Ambulatory referral to Cardiology  Chest pain, unspecified type - Plan: Troponin I, Ambulatory referral to Cardiology  Peripheral polyneuropathy - Plan: gabapentin  (NEURONTIN ) 300 MG capsule  Patient seen today with concern of elevated blood pressure, suspect she has mild  hypertension.  Will start her on losartan  25 mg, she will keep an eye on her blood pressure at home and let me know how she responds.  I can adjust the dosage as needed.  Referral made to cardiology as well; she has several risk factors for heart disease and I would like to make sure her shortness of breath is not also related to her heart.  We will obtain a troponin today as well I refilled gabapentin  and she uses for neuropathy  Signed Harlene Schroeder, MD  Received your troponin, message to patient  Results for orders placed or performed in visit on 02/12/24  EKG  12-Lead   Collection Time: 02/12/24  3:47 PM  Result Value Ref Range   EKG 12 lead    Troponin I   Collection Time: 02/12/24  4:08 PM  Result Value Ref Range   Troponin I 3 < OR = 47 ng/L

## 2024-02-12 NOTE — Patient Instructions (Addendum)
 Let's start you on losartan  25 mg; let me know if your home BP is not coming under about 130/85 by the end of the week.  In that case we can go up on your losartan    Please do call cardiology asap and schedule an appt as soon as you can! They should give you a call but you can also reach out  If you have any new or persistent chest pain please do seek care

## 2024-02-12 NOTE — Telephone Encounter (Signed)
 FYI Only or Action Required?: FYI only for provider.  Patient was last seen in primary care on 11/02/2023 by [name], NP.  Called Nurse Triage reporting Hypertension.  Symptoms began yesterday.  Interventions attempted: Nothing.  Symptoms are: unchanged.  Triage Disposition: See Physician Within 24 Hours  Patient/caregiver understands and will follow disposition?: Yes Reason for Disposition  Systolic BP >= 180 OR Diastolic >= 110  Answer Assessment - Initial Assessment Questions 1. BLOOD PRESSURE: What is your blood pressure? Did you take at least two measurements 5 minutes apart?     159/91 today, yesterday 150/101, 149/117 2. ONSET: When did you take your blood pressure?     Yesterday and today 3. HOW: How did you take your blood pressure? (e.g., automatic home BP monitor, visiting nurse)     Automatic- arm 4. HISTORY: Do you have a history of high blood pressure?     Patient was seen at [facility]- patient is having COPD flare 5. MEDICINES: Are you taking any medicines for blood pressure? Have you missed any doses recently?     No- years ago she had elevated BP 6. OTHER SYMPTOMS: Do you have any symptoms? (e.g., blurred vision, chest pain, difficulty breathing, headache, weakness)     Headache, dizziness at times  Protocols used: Blood Pressure - High-A-AH    Copied from CRM #[id number]. Topic: Clinical - Red Word Triage >> Feb 12, 2024  8:25 AM Burnard DEL wrote: Red Word that prompted transfer to Nurse Triage: elevated blood pressure 159/91, headache,dizziness

## 2024-02-13 ENCOUNTER — Ambulatory Visit: Payer: Self-pay | Admitting: Family Medicine

## 2024-02-13 LAB — BASIC METABOLIC PANEL WITH GFR
BUN: 18 mg/dL (ref 6–23)
CO2: 22 meq/L (ref 19–32)
Calcium: 9.1 mg/dL (ref 8.4–10.5)
Chloride: 104 meq/L (ref 96–112)
Creatinine, Ser: 0.69 mg/dL (ref 0.40–1.20)
GFR: 98.38 mL/min (ref >60.00)
Glucose, Bld: 126 mg/dL — ABNORMAL HIGH (ref 70–99)
Potassium: 3.8 meq/L (ref 3.5–5.1)
Sodium: 139 meq/L (ref 135–145)

## 2024-02-16 ENCOUNTER — Ambulatory Visit: Payer: Self-pay

## 2024-02-16 NOTE — Telephone Encounter (Signed)
 Pt called and lvm to let her know Dr. Watt sent her  a mychart message or can call back -- ok for e2c2 to relay message   Hi Stellah- I got your message.  Since your BP is still too high let's have you increase your losartan  to 2 pills (50mg ).  I would do this first instead of calling in a new rx as it will give us  a chance to try it out for you and see what dose you ultimately need    JC  This MyChart message has not been read.

## 2024-02-16 NOTE — Telephone Encounter (Signed)
 Pt states that she was instructed By dr Watt to call back for increase in BP medication if her BP hasn't gone down to 130's. States that she has not had a reading that low, states still running in 160-170. Just took her BP and it was 161/77.   Please advise

## 2024-02-16 NOTE — Telephone Encounter (Signed)
 FYI Only or Action Required?: FYI only for provider.  Patient was last seen in primary care on 02/12/2024 by Copland, Harlene BROCKS, MD.  Called Nurse Triage reporting Hypertension.  Symptoms began several days ago.  Interventions attempted: Prescription medications: losartan .  Symptoms are: unchanged.  Triage Disposition: See PCP When Office is Open (Within 3 Days)  Patient/caregiver understands and will follow disposition?: No, wishes to speak with PCP     Copied from CRM #[id number]. Topic: Clinical - Red Word Triage >> Feb 16, 2024  3:19 PM Shereese L wrote: [facility] that prompted transfer to Nurse Triage:  Patient pressure increase and Was advised if didn't go down to 130 the dosage will be increased Current pressure is 161/77 Reason for Disposition  Systolic BP >= 160 OR Diastolic >= 100  Answer Assessment - Initial Assessment Questions 1. BLOOD PRESSURE: What is your blood pressure? Did you take at least two measurements 5 minutes apart?     161/77  2. ONSET: When did you take your blood pressure?     Just took it after work 3. HOW: How did you take your blood pressure? (e.g., automatic home BP monitor, visiting nurse)     Home bp cuff 4. HISTORY: Do you have a history of high blood pressure?     htn 5. MEDICINES: Are you taking any medicines for blood pressure? Have you missed any doses recently?     Losartan  25mg  6. OTHER SYMPTOMS: Do you have any symptoms? (e.g., blurred vision, chest pain, difficulty breathing, headache, weakness)     Headache and dizziness and they are the same as when she was seen in the clinic on 02/12/24  Protocols used: Blood Pressure - High-A-AH

## 2024-02-16 NOTE — Telephone Encounter (Signed)
 Copied from CRM [id number]. Topic: Clinical - Medication Question >> Feb 16, 2024 10:07 AM Viola F wrote: Patient seen Dr. Watt 02/12/24, she was told to call back regarding the Losartan  (COZAAR ) 25 MG tablet - her blood pressure today was 170/95 and has been feeling lightheaded, dizzy and has been having headaches. She refused to speak to triage nurse due to her being at work and requested to up the dosage of the Losartan  and have it sent to the [facility] on file.

## 2024-02-20 NOTE — Telephone Encounter (Signed)
 Voicemail was left for patient on 02/16/2024 with instructions on medication. Will call patient again.  Copied from CRM #[id number]. Topic: Clinical - Medication Question >> Feb 19, 2024  4:11 PM Marissa P wrote: Reason for CRM: Patient called to see if she should be taking another blood pressure med or continue because its been 3 days and it has not lowered it please call her

## 2024-02-21 DIAGNOSIS — B009 Herpesviral infection, unspecified: Secondary | ICD-10-CM | POA: Insufficient documentation

## 2024-02-22 ENCOUNTER — Ambulatory Visit: Attending: Cardiology | Admitting: Cardiology

## 2024-02-22 ENCOUNTER — Encounter: Payer: Self-pay | Admitting: Cardiology

## 2024-02-22 VITALS — BP 132/80 | HR 72 | Ht 64.0 in | Wt 124.0 lb

## 2024-02-22 DIAGNOSIS — I10 Essential (primary) hypertension: Secondary | ICD-10-CM | POA: Diagnosis present

## 2024-02-22 DIAGNOSIS — Z720 Tobacco use: Secondary | ICD-10-CM | POA: Diagnosis present

## 2024-02-22 DIAGNOSIS — I209 Angina pectoris, unspecified: Secondary | ICD-10-CM | POA: Insufficient documentation

## 2024-02-22 DIAGNOSIS — J431 Panlobular emphysema: Secondary | ICD-10-CM | POA: Insufficient documentation

## 2024-02-22 DIAGNOSIS — I259 Chronic ischemic heart disease, unspecified: Secondary | ICD-10-CM | POA: Diagnosis present

## 2024-02-22 MED ORDER — NITROGLYCERIN 0.4 MG SL SUBL: 0.4000 mg | SUBLINGUAL_TABLET | SUBLINGUAL | 6 refills | Status: AC | PRN

## 2024-02-22 MED ORDER — METOPROLOL TARTRATE 100 MG PO TABS: 100.0000 mg | ORAL_TABLET | Freq: Once | ORAL | 0 refills | Status: AC

## 2024-02-22 NOTE — Progress Notes (Signed)
 Cardiology Office Note:    Date:  02/22/2024   ID:  Caitlin Pineda, DOB Nov 08, 1970, MRN [id number]  PCP:  [name], NP  Cardiologist:  Jennifer JONELLE Crape, MD   Referring MD: Watt Harlene BROCKS, MD    ASSESSMENT:    1. Hypertension, unspecified type   2. Angina pectoris (HCC)   3. Panlobular emphysema (HCC)   4. Tobacco abuse    PLAN:    In order of problems listed above:  Angina pectoris: Patient's symptoms are concerning.  She has multiple risk factors for coronary artery disease.  Multiple modalities of evaluation discussed with her.  She has been for CT coronary with FFR and I will schedule this for her.  Sublingual nitroglycerin  prescription was sent, its protocol and 911 protocol explained and the patient vocalized understanding questions were answered to the patient's satisfaction Essential hypertension: Blood pressure is stable and diet was emphasized.   salt intake issues were discussed.  Lifestyle modification urged. Cigarette smoker: I spent 5 minutes with the patient discussing solely about smoking. Smoking cessation was counseled. I suggested to the patient also different medications and pharmacological interventions. Patient is keen to try stopping on its own at this time. He will get back to me if he needs any further assistance in this matter. Cardiac murmur: Echocardiogram will be done to assess murmur heard on auscultation. COPD: Managed by primary care.  Again I had an extensive discussion about her about the lung damage seen on CT scan and she promises to do better about taking care of her health. Patient will be seen in follow-up appointment in 6 months or earlier if the patient has any concerns.    Medication Adjustments/Labs and Tests Ordered: Current medicines are reviewed at length with the patient today.  Concerns regarding medicines are outlined above.  Orders Placed This Encounter  Procedures   EKG 12-Lead   No orders of the defined types were  placed in this encounter.    History of Present Illness:    Caitlin Pineda is a 53 y.o. female who is being seen today for the evaluation of chest pain at the request of Caitlin Pineda, Harlene BROCKS, MD. patient has past medical history of mixed dyslipidemia, COPD and is active heavy smoker.  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  She mentions to me that when she is under stress she has substernal chest pain radiating to the back.  No orthopnea or PND.  This is concerning to her.  At the time of my evaluation, the patient is alert awake oriented and in no distress.  She is under stress because her dad has dementia and she lives with him.  Past Medical History:  Diagnosis Date   Borderline personality disorder (HCC) 09/03/2015   Chronic cough 04/13/2023   COPD (chronic obstructive pulmonary disease) (HCC)    Disequilibrium 09/25/2018   Gastroesophageal reflux disease 04/13/2023   HSV-2 infection    2023, [facility], tx with Valtrex   Hyperglycemia 04/13/2023   Hypertension    Lumbar radiculopathy 02/10/2017   Formatting of this note might be different from the original. Added automatically from request for surgery [id number]  Formatting of this note might be different from the original. Added automatically from request for surgery [id number]   Major depression, recurrent (HCC) 07/11/2015   Tobacco abuse 09/25/2018   Vulva cancer [facility])     Past Surgical History:  Procedure Laterality Date   CESAREAN SECTION  laser surgery for vulva cancer     TUBAL LIGATION      Current Medications: Current Meds  Medication Sig   albuterol  (VENTOLIN  HFA) 108 (90 Base) MCG/ACT inhaler Inhale 2 puffs into the lungs every 6 (six) hours as needed for wheezing or shortness of breath.   ARIPiprazole (ABILIFY) 5 MG tablet Take 5 mg by mouth daily.   Fluticasone-Umeclidin-Vilant (TRELEGY ELLIPTA ) 100-62.5-25 MCG/ACT AEPB Inhale 1 puff into the lungs daily.   gabapentin  (NEURONTIN ) 300 MG  capsule Take 1 capsule (300 mg total) by mouth daily.   HYDROcodone -acetaminophen  (NORCO) 10-325 MG tablet Take 1 tablet by mouth every 4 (four) hours as needed.   losartan  (COZAAR ) 25 MG tablet Take 1 tablet (25 mg total) by mouth daily.   methocarbamol  (ROBAXIN ) 500 MG tablet Take 1 tablet (500 mg total) by mouth 3 (three) times daily. (Patient taking differently: Take 500 mg by mouth as needed for muscle spasms.)     Allergies:   Prednisone   Social History   Socioeconomic History   Marital status: Single    Spouse name: Not on file   Number of children: Not on file   Years of education: Not on file   Highest education level: Not on file  Occupational History   Not on file  Tobacco Use   Smoking status: Every Day    Current packs/day: 1.50    Types: Cigarettes   Smokeless tobacco: Never  Substance and Sexual Activity   Alcohol use: Yes    Comment: 1 pint liquor every afternoon   Drug use: Yes    Types: Cocaine   Sexual activity: Not on file  Other Topics Concern   Not on file  Social History Narrative   ** Merged History Encounter **       Social Drivers of Corporate investment banker Strain: Not on file  Food Insecurity: No Food Insecurity (12/24/2020)   Received from [facility]   Hunger Vital Sign    Within the past 12 months, you worried that your food would run out before you got the money to buy more.: Never true    Within the past 12 months, the food you bought just didn't last and you didn't have money to get more.: Never true  Transportation Needs: Not on file  Physical Activity: Not on file  Stress: Not on file  Social Connections: Unknown (11/22/2021)   Received from [facility]   Social Network    Social Network: Not on file     Family History: The patient's family history includes COPD in her father; Diabetes in her father; Gout in her father; Hypertension in her father and mother.  ROS:   Please see the history of present illness.    All  other systems reviewed and are negative.  EKGs/Labs/Other Studies Reviewed:    The following studies were reviewed today:  EKG Interpretation Date/Time:  Thursday February 22 2024 09:40:08 EDT Ventricular Rate:  72 PR Interval:  148 QRS Duration:  76 QT Interval:  438 QTC Calculation: 479 R Axis:   81  Text Interpretation: Normal sinus rhythm Nonspecific T wave abnormality Prolonged QT Abnormal ECG When compared with ECG of 04-Oct-2023 19:05, Nonspecific T wave abnormality now evident in Lateral leads QT has lengthened Confirmed by Edwyna Backers [phone]) on 02/22/2024 10:05:00 AM     Recent Labs: 11/02/2023: ALT 10; Hemoglobin 14.2; Platelets 229.0; TSH 0.87 02/12/2024: BUN 18; Creatinine, Ser 0.69; Potassium 3.8; Sodium 139  Recent Lipid  Panel    Component Value Date/Time   CHOL 200 04/13/2023 1449   TRIG 132.0 04/13/2023 1449   HDL 60.50 04/13/2023 1449   CHOLHDL 3 04/13/2023 1449   VLDL 26.4 04/13/2023 1449   LDLCALC 113 (H) 04/13/2023 1449    Physical Exam:    VS:  BP 132/80   Pulse 72   Ht 5' 4 (1.626 m)   Wt 124 lb (56.2 kg)   SpO2 93%   BMI 21.28 kg/m     Wt Readings from Last 3 Encounters:  02/22/24 124 lb (56.2 kg)  02/12/24 124 lb (56.2 kg)  11/07/23 117 lb (53.1 kg)     GEN: Patient is in no acute distress HEENT: Normal NECK: No JVD; No carotid bruits LYMPHATICS: No lymphadenopathy CARDIAC: S1 S2 regular, 2/6 systolic murmur at the apex. RESPIRATORY:  Clear to auscultation without rales, wheezing or rhonchi  ABDOMEN: Soft, non-tender, non-distended MUSCULOSKELETAL:  No edema; No deformity  SKIN: Warm and dry NEUROLOGIC:  Alert and oriented x 3 PSYCHIATRIC:  Normal affect    Signed, Jennifer JONELLE Crape, MD  02/22/2024 10:17 AM    [facility]

## 2024-02-22 NOTE — Patient Instructions (Addendum)
 Medication Instructions:  Your physician has recommended you make the following change in your medication:   Use nitroglycerin  1 tablet placed under the tongue at the first sign of chest pain or an angina attack. 1 tablet may be used every 5 minutes as needed, for up to 15 minutes. Do not take more than 3 tablets in 15 minutes. If pain persist call 911 or go to the nearest ED.  *If you need a refill on your cardiac medications before your next appointment, please call your pharmacy*   Lab Work: None ordered If you have labs (blood work) drawn today and your tests are completely normal, you will receive your results only by: MyChart Message (if you have MyChart) OR A paper copy in the mail If you have any lab test that is abnormal or we need to change your treatment, we will call you to review the results.  Testing/Procedures: Your physician has requested that you have an echocardiogram. Echocardiography is a painless test that uses sound waves to create images of your heart. It provides your doctor with information about the size and shape of your heart and how well your heart's chambers and valves are working. This procedure takes approximately one hour. There are no restrictions for this procedure. Please do NOT wear cologne, perfume, aftershave, or lotions (deodorant is allowed). Please arrive 15 minutes prior to your appointment time.  Please note: We ask at that you not bring children with you during ultrasound (echo/ vascular) testing. Due to room size and safety concerns, children are not allowed in the ultrasound rooms during exams. Our front office staff cannot provide observation of children in our lobby area while testing is being conducted. An adult accompanying a patient to their appointment will only be allowed in the ultrasound room at the discretion of the ultrasound technician under special circumstances. We apologize for any inconvenience.    Your cardiac CT will be  scheduled at one of the below locations:   [facility] [address]  [place], [place] [address]   If scheduled at the Heart and Vascular Tower at [address], please enter the parking lot using the [address] entrance and use the FREE valet service at the patient drop-off area. Enter the building and check-in with registration on the main floor.  Please follow these instructions carefully (unless otherwise directed):  An IV will be required for this test and Nitroglycerin  will be given.   On the Night Before the Test: Be sure to Drink plenty of water. Do not consume any caffeinated/decaffeinated beverages or chocolate 12 hours prior to your test. Do not take any antihistamines 12 hours prior to your test.  On the Day of the Test: Drink plenty of water until 1 hour prior to the test. Do not eat any food 1 hour prior to test. You may take your regular medications prior to the test.  Take metoprolol  (Lopressor ) 100 mg two hours prior to test. FEMALES- please wear underwire-free bra if available, avoid dresses & tight clothing      After the Test: Drink plenty of water. After receiving IV contrast, you may experience a mild flushed feeling. This is normal. On occasion, you may experience a mild rash up to 24 hours after the test. This is not dangerous. If this occurs, you can take Benadryl 25 mg, Zyrtec, Claritin, or Allegra and increase your fluid intake. (Patients taking Tikosyn should avoid Benadryl, and may take Zyrtec, Claritin, or Allegra) If  you experience trouble breathing, this can be serious. If it is severe call 911 IMMEDIATELY. If it is mild, please call our office.  We will call to schedule your test 2-4 weeks out understanding that some insurance companies will need an authorization prior to the service being performed.   For more information and frequently asked questions, please visit our website :  [email or web address]  For non-scheduling related questions, please contact the cardiac imaging nurse navigator should you have any questions/concerns: Cardiac Imaging Nurse Navigators Direct Office Dial: [phone]   For scheduling needs, including cancellations and rescheduling, please call [place], [phone].  Follow-Up: At [facility], you and your health needs are our priority.  As part of our continuing mission to provide you with exceptional heart care, we have created designated Provider Care Teams.  These Care Teams include your primary Cardiologist (physician) and Advanced Practice Providers (APPs -  Physician Assistants and Nurse Practitioners) who all work together to provide you with the care you need, when you need it.  We recommend signing up for the patient portal called MyChart.  Sign up information is provided on this After Visit Summary.  MyChart is used to connect with patients for Virtual Visits (Telemedicine).  Patients are able to view lab/test results, encounter notes, upcoming appointments, etc.  Non-urgent messages can be sent to your provider as well.   To learn more about what you can do with MyChart, go to [email or web address].    Your next appointment:   9 month(s)  The format for your next appointment:   In Person  Provider:   Jennifer Crape, MD   Other Instructions Echocardiogram An echocardiogram is a test that uses sound waves (ultrasound) to produce images of the heart. Images from an echocardiogram can provide important information about: Heart size and shape. The size and thickness and movement of your heart's walls. Heart muscle function and strength. Heart valve function or if you have stenosis. Stenosis is when the heart valves are too narrow. If blood is flowing backward through the heart valves (regurgitation). A tumor or infectious growth around the heart valves. Areas of heart muscle that are not working well  because of poor blood flow or injury from a heart attack. Aneurysm detection. An aneurysm is a weak or damaged part of an artery wall. The wall bulges out from the normal force of blood pumping through the body. Tell a health care provider about: Any allergies you have. All medicines you are taking, including vitamins, herbs, eye drops, creams, and over-the-counter medicines. Any blood disorders you have. Any surgeries you have had. Any medical conditions you have. Whether you are pregnant or may be pregnant. What are the risks? Generally, this is a safe test. However, problems may occur, including an allergic reaction to dye (contrast) that may be used during the test. What happens before the test? No specific preparation is needed. You may eat and drink normally. What happens during the test? You will take off your clothes from the waist up and put on a hospital gown. Electrodes or electrocardiogram (ECG)patches may be placed on your chest. The electrodes or patches are then connected to a device that monitors your heart rate and rhythm. You will lie down on a table for an ultrasound exam. A gel will be applied to your chest to help sound waves pass through your skin. A handheld device, called a transducer, will be pressed against your chest and moved over your heart. The transducer produces sound waves that travel to  your heart and bounce back (or echo back) to the transducer. These sound waves will be captured in real-time and changed into images of your heart that can be viewed on a video monitor. The images will be recorded on a computer and reviewed by your health care provider. You may be asked to change positions or hold your breath for a short time. This makes it easier to get different views or better views of your heart. In some cases, you may receive contrast through an IV in one of your veins. This can improve the quality of the pictures from your heart. The procedure may vary  among health care providers and hospitals.   What can I expect after the test? You may return to your normal, everyday life, including diet, activities, and medicines, unless your health care provider tells you not to do that. Follow these instructions at home: It is up to you to get the results of your test. Ask your health care provider, or the department that is doing the test, when your results will be ready. Keep all follow-up visits. This is important. Summary An echocardiogram is a test that uses sound waves (ultrasound) to produce images of the heart. Images from an echocardiogram can provide important information about the size and shape of your heart, heart muscle function, heart valve function, and other possible heart problems. You do not need to do anything to prepare before this test. You may eat and drink normally. After the echocardiogram is completed, you may return to your normal, everyday life, unless your health care provider tells you not to do that. This information is not intended to replace advice given to you by your health care provider. Make sure you discuss any questions you have with your health care provider. Document Revised: 02/18/2020 Document Reviewed: 02/18/2020 Elsevier Patient Education  2021 Elsevier Inc.   Important Information About Sugar

## 2024-02-27 ENCOUNTER — Encounter (HOSPITAL_COMMUNITY): Payer: Self-pay

## 2024-02-28 ENCOUNTER — Telehealth: Payer: Self-pay | Admitting: Cardiology

## 2024-02-28 ENCOUNTER — Ambulatory Visit: Payer: Self-pay | Admitting: *Deleted

## 2024-02-28 ENCOUNTER — Ambulatory Visit: Admitting: Family Medicine

## 2024-02-28 NOTE — Telephone Encounter (Signed)
 Message from [facility] sent at 02/28/2024 11:33 AM EDT  Summary: nausea,elevated blood pressure   Reason for Triage: nausea, elevated blood pressure          Call History  Contact Date/Time Type Contact Phone/Fax By  02/28/2024 11:31 AM EDT Phone ([address] Caitlin Pineda, Caitlin Pineda (Self) [phone] [place] [facility]

## 2024-02-28 NOTE — Telephone Encounter (Signed)
 Pt c/o BP issue: STAT if pt c/o blurred vision, one-sided weakness or slurred speech.  STAT if BP is GREATER than 180/120 TODAY.  STAT if BP is LESS than 90/60 and SYMPTOMATIC TODAY  1. What is your BP concern?   See below  2. Have you taken any BP medication today?  Yes - this morning around 5:00 am  3. What are your last 5 BP readings?  169/84 today at 10:00 am  4. Are you having any other symptoms (ex. Dizziness, headache, blurred vision, passed out)?   Nauseous, stabbing pain in chest   Pt c/o of Chest Pain: STAT if active CP, including tightness, pressure, jaw pain, radiating pain to shoulder/upper arm/back, CP unrelieved by Nitro. Symptoms reported of SOB, nausea, vomiting, sweating.  1. Are you having CP right now?   No - Last night, stabbing pain with chest pressure  2. Are you experiencing any other symptoms (ex. SOB, nausea, vomiting, sweating)?   Nausea, SOB  3. Is your CP continuous or coming and going?   Patient stated it was continuous until she took Nitroglycerin   4. Have you taken Nitroglycerin ?   Yes - last night  5. How long have you been experiencing CP?   Last night about 8:00 pm    6. If NO CP at time of call then end call with telling Pt to call back or call 911 if Chest pain returns prior to return call from triage team.    Patient stated the chest pain has been off and on today.

## 2024-02-28 NOTE — Telephone Encounter (Signed)
 FYI Only or Action Required?: FYI only for provider.  Patient was last seen in primary care on 02/12/2024 by Copland, Harlene BROCKS, MD.  Called Nurse Triage reporting Hypertension.  Symptoms began today.  Interventions attempted: Nothing.  Symptoms are: stable.  Triage Disposition: See PCP When Office is Open (Within 3 Days)  Patient/caregiver understands and will follow disposition?: Yes      Reason for Disposition  Systolic BP >= 160 OR Diastolic >= 100  Answer Assessment - Initial Assessment Questions 1. BLOOD PRESSURE: What is your blood pressure? Did you take at least two measurements 5 minutes apart?     164/94 this morning     167/90-70 @1619     137/87 1620    147/76-71 @1629  2. ONSET: When did you take your blood pressure?     today 3. HOW: How did you take your blood pressure? (e.g., automatic home BP monitor, visiting nurse)     automatic 4. HISTORY: Do you have a history of high blood pressure?     HTN 5. MEDICINES: Are you taking any medicines for blood pressure? Have you missed any doses recently?     No missed doses 6. OTHER SYMPTOMS: Do you have any symptoms? (e.g., blurred vision, chest pain, difficulty breathing, headache, weakness)     Nausea, chest pain - (none now) , lightheaded/dizziness, overall weakness, extreme fatigues 7. PREGNANCY: Is there any chance you are pregnant? When was your last menstrual period?     Na  Attempted to schedule pt appt this week: however pt stated she will not have money until next for doctor.  Scheduled pt with PCP next week & also informed pt if s/s get worse to call us  back to update &/or go to ED  Pt stated last night she had chest pain ended up taking x2 nitroglycerin  and pain finally went away.  Pt asked if nausea could be related to chest pain last night  Protocols used: Blood Pressure - High-A-AH

## 2024-02-28 NOTE — Telephone Encounter (Signed)
 First attempt to return her call.   Left a voicemail to call back.  Routed back to triage queue for further attempts.

## 2024-02-28 NOTE — Telephone Encounter (Signed)
Left message to call back about symptoms. °

## 2024-02-28 NOTE — Telephone Encounter (Signed)
 Spoke with pt who states that she has been having chest pain that woke her up from her sleep and has nausea. Pt states that the pain was relieved by NTG but returned. Advised to go to the ED for evaluation as sx are concerning. Pt is agreeable and verbalized understanding.

## 2024-03-01 ENCOUNTER — Ambulatory Visit (HOSPITAL_COMMUNITY)

## 2024-03-06 ENCOUNTER — Ambulatory Visit: Payer: Self-pay | Admitting: Family Medicine

## 2024-03-06 ENCOUNTER — Ambulatory Visit: Admitting: Family Medicine

## 2024-03-06 ENCOUNTER — Encounter (HOSPITAL_COMMUNITY): Payer: Self-pay

## 2024-03-06 ENCOUNTER — Encounter: Payer: Self-pay | Admitting: Family Medicine

## 2024-03-06 ENCOUNTER — Ambulatory Visit (HOSPITAL_BASED_OUTPATIENT_CLINIC_OR_DEPARTMENT_OTHER)
Admission: RE | Admit: 2024-03-06 | Discharge: 2024-03-06 | Disposition: A | Discharge status: Home / Self Care | Source: Ambulatory Visit | Attending: Family Medicine | Admitting: Family Medicine

## 2024-03-06 VITALS — BP 106/53 | HR 64 | Ht 64.0 in | Wt 126.0 lb

## 2024-03-06 DIAGNOSIS — J449 Chronic obstructive pulmonary disease, unspecified: Secondary | ICD-10-CM | POA: Insufficient documentation

## 2024-03-06 DIAGNOSIS — F339 Major depressive disorder, recurrent, unspecified: Secondary | ICD-10-CM

## 2024-03-06 DIAGNOSIS — I209 Angina pectoris, unspecified: Secondary | ICD-10-CM

## 2024-03-06 DIAGNOSIS — Z720 Tobacco use: Secondary | ICD-10-CM | POA: Diagnosis not present

## 2024-03-06 DIAGNOSIS — R053 Chronic cough: Secondary | ICD-10-CM

## 2024-03-06 DIAGNOSIS — I10 Essential (primary) hypertension: Secondary | ICD-10-CM

## 2024-03-06 MED ORDER — TRELEGY ELLIPTA 100-62.5-25 MCG/ACT IN AEPB: 1.0000 | INHALATION_SPRAY | Freq: Every day | RESPIRATORY_TRACT | 1 refills | Status: DC

## 2024-03-06 MED ORDER — LOSARTAN POTASSIUM 25 MG PO TABS: 25.0000 mg | ORAL_TABLET | Freq: Two times a day (BID) | ORAL | Status: DC

## 2024-03-06 MED ORDER — BUPROPION HCL ER (SR) 150 MG PO TB12: 150.0000 mg | ORAL_TABLET | Freq: Two times a day (BID) | ORAL | 1 refills | Status: AC

## 2024-03-06 NOTE — Progress Notes (Signed)
 Established Patient Office Visit  Subjective   Patient ID: Caitlin Pineda, female    DOB: Aug 09, 1970  Age: 53 y.o. MRN: [id number]  Chief Complaint  Patient presents with   Hypertension      Discussed the use of AI scribe software for clinical note transcription with the patient, who gave verbal consent to proceed.  History of Present Illness Caitlin Pineda is a 53 year old female with hypertension and COPD who presents with fluctuating blood pressure and respiratory issues.  She experiences significant fluctuations in blood pressure, with morning readings as high as 170/171 mmHg and a more stable reading of 135/80 mmHg today. Her blood pressure tends to rise again by the evening. She takes 2 tabs of losartan  25 mg at 5 AM, but the effect does not last all day. At her workplace, a nursing home, her systolic blood pressure often reads 138-140 mmHg during work hours, but it increases when she returns home.  She experiences intermittent chest pain requiring three nitroglycerin  tablets for relief last week. The pain is described as feeling like 'someone sitting on my chest and stabbing me in the heart.' Stress is a noted trigger, and she last experienced chest pain a day or two ago. She has a history of an abnormal EKG and is scheduled for a CT of the heart and an echocardiogram. Following with [facility] cardiology.   She has difficulty breathing, and her inhalers are not effective. She smokes about a pack and a half per day and has tried nicotine patches without success. She is coughing up green-tinged mucus, which has worsened over the past two weeks. She had a COPD flare-up three weeks ago, treated with prednisone and Cefdinir which improved her symptoms temporarily.  She is not getting adequate sleep due to her father's dogs, and she cares for her father who has dementia and schizophrenia. She has been attending [facility] for psychiatric care and needs to make a follow-up appointment. She  feels stressed, especially at home, and notes that stress exacerbates her symptoms.  No fever or hemoptysis, but she reports coughing up green speckled mucus. She had a chest x-ray in March. She was on antibiotics and steroids for a COPD flare-up about three weeks ago, which initially resolved her symptoms.          03/06/2024    9:07 AM 11/02/2023    8:35 AM 08/08/2023    9:24 AM  PHQ9 SCORE ONLY  PHQ-9 Total Score 13 24  11       Data saved with a previous flowsheet row definition      03/06/2024    9:07 AM 11/02/2023    8:35 AM  GAD 7 : Generalized Anxiety Score  Nervous, Anxious, on Edge 1 3  Control/stop worrying 1 3  Worry too much - different things 1 3  Trouble relaxing 1 3  Restless 1 1  Easily annoyed or irritable 2 3  Afraid - awful might happen 1 3  Total GAD 7 Score 8 19  Anxiety Difficulty Somewhat difficult Extremely difficult         ROS All review of systems negative except what is listed in the HPI    Objective:     BP (!) 106/53   Pulse 64   Ht 5' 4 (1.626 m)   Wt 126 lb (57.2 kg)   SpO2 96%   BMI 21.63 kg/m    Physical Exam Vitals reviewed.  Constitutional:      Appearance: Normal  appearance.  Cardiovascular:     Rate and Rhythm: Normal rate and regular rhythm.  Pulmonary:     Effort: Pulmonary effort is normal.     Breath sounds: No wheezing.     Comments: Mildly diminished Musculoskeletal:     Right lower leg: No edema.     Left lower leg: No edema.  Skin:    General: Skin is warm and dry.  Neurological:     Mental Status: She is alert and oriented to person, place, and time.  Psychiatric:        Mood and Affect: Mood normal.        Behavior: Behavior normal.        Thought Content: Thought content normal.        Judgment: Judgment normal.      No results found for any visits on 03/06/24.    The 10-year ASCVD risk score (Arnett DK, et al., 2019) is: 3.8%    Assessment & Plan:   Problem List Items Addressed  This Visit       Active Problems   Hypertension - Primary   Relevant Medications   losartan  (COZAAR ) 25 MG tablet   COPD (chronic obstructive pulmonary disease) (HCC)   Relevant Medications   Fluticasone-Umeclidin-Vilant (TRELEGY ELLIPTA ) 100-62.5-25 MCG/ACT AEPB   Other Relevant Orders   Ambulatory referral to Pulmonology   DG Chest 2 View   Major depression, recurrent (HCC)   Relevant Medications   buPROPion  (WELLBUTRIN  SR) 150 MG 12 hr tablet   Tobacco abuse   Relevant Medications   buPROPion  (WELLBUTRIN  SR) 150 MG 12 hr tablet   Other Relevant Orders   Ambulatory referral to Pulmonology   Ambulatory Referral Lung Cancer Screening [facility]   DG Chest 2 View   Chronic cough   Relevant Orders   Ambulatory referral to Pulmonology   DG Chest 2 View   Angina pectoris (HCC)   Relevant Medications   losartan  (COZAAR ) 25 MG tablet     Assessment & Plan Essential hypertension Blood pressure readings variable, indicating potential overmedication with current regimen. - Adjust Losartan  to 25 mg twice daily. - Monitor blood pressure in morning and evening.  Chronic obstructive pulmonary disease (COPD) with acute exacerbation Increased shortness of breath and speckled green mucus suggest exacerbation. Recent treatment with Cefdinir and prednisone. - Order stat chest x-ray. Treat as indicated.  - Refer to pulmonology. - Refill Trelegy inhaler at [facility] on [address] in [place].  Nicotine dependence Desire to quit smoking, previous attempts with nicotine patches unsuccessful. Vaping not recommended. - Prescribe Wellbutrin . - Advise to avoid vaping and nicotine products. - Refer to pulmonology. - Set up lung cancer screening program.  Chest pain Intermittent chest pain, severe episode recently requiring nitroglycerin . Stress potential trigger. Awaiting cardiology evaluation. - Instruct to call 911 if three nitroglycerin  tablets are needed. - Ensure follow-up  with cardiology for echocardiogram and CT of the heart. - Monitor for new or worsening symptoms.  Depression Feeling down and overwhelmed, related to life stressors and caregiving responsibilities. Currently under care at [facility]. - Encourage follow-up with psychiatric care at [facility]. - Consider Wellbutrin  for dual treatment of depression and smoking cessation.      Return for - pending results or sooner if needed.    Waddell KATHEE Mon, NP

## 2024-03-08 ENCOUNTER — Ambulatory Visit: Payer: Self-pay

## 2024-03-08 ENCOUNTER — Ambulatory Visit (HOSPITAL_COMMUNITY)
Admission: RE | Admit: 2024-03-08 | Discharge: 2024-03-08 | Disposition: A | Discharge status: Home / Self Care | Source: Ambulatory Visit | Attending: Cardiology | Admitting: Cardiology

## 2024-03-08 DIAGNOSIS — I259 Chronic ischemic heart disease, unspecified: Secondary | ICD-10-CM | POA: Insufficient documentation

## 2024-03-08 DIAGNOSIS — I209 Angina pectoris, unspecified: Secondary | ICD-10-CM | POA: Diagnosis present

## 2024-03-08 MED ORDER — IOHEXOL 350 MG/ML SOLN
75.0000 mL | Freq: Once | INTRAVENOUS | Status: AC | PRN
  Administered 2024-03-08: 75 mL via INTRAVENOUS

## 2024-03-08 MED ORDER — NITROGLYCERIN 0.4 MG SL SUBL
0.8000 mg | SUBLINGUAL_TABLET | Freq: Once | SUBLINGUAL | Status: AC
  Administered 2024-03-08: 0.8 mg via SUBLINGUAL

## 2024-03-08 NOTE — Telephone Encounter (Signed)
 FYI Only or Action Required?: Action required by provider: update on patient condition and declined ED.  Patient was last seen in primary care on 03/06/2024 by [name], NP.  Called Nurse Triage reporting Hypertension.  Symptoms began today.  Interventions attempted: Nothing.  Symptoms are: gradually worsening.  Triage Disposition: Go to ED Now (Notify PCP)  Patient/caregiver understands and will follow disposition?: No, refuses disposition                             Copied from CRM [id number]. Topic: Clinical - Red Word Triage >> Mar 08, 2024 10:55 AM Dedra B wrote: [facility] that prompted transfer to Nurse Triage: Pt called in with elevated BP 170/95 and she already took her BP meds at [facility] and it still hasn't come down. Pt also having dizziness and nausea. Pt has not been able to eat anything in 2 days. Warm transfer to [facility] nurse triage. Reason for Disposition  [1] Systolic BP >= 160 OR Diastolic >= 100 AND [2] cardiac (e.g., breathing difficulty, chest pain) or neurologic symptoms (e.g., new-onset blurred or double vision, unsteady gait)  Answer Assessment - Initial Assessment Questions 1. BLOOD PRESSURE: What is your blood pressure? Did you take at least two measurements 5 minutes apart?     150/80s first thing this morning, 170/95 30 minutes ago 2. ONSET: When did you take your blood pressure?     Today 3. HOW: How did you take your blood pressure? (e.g., automatic home BP monitor, visiting nurse)     Automatic cuff at home 4. HISTORY: Do you have a history of high blood pressure?     Yes 5. MEDICINES: Are you taking any medicines for blood pressure? Have you missed any doses recently?     States she took her BP meds at 5 AM this morning, BP medication was adjusted at appointment earlier this week  6. OTHER SYMPTOMS: Do you have any symptoms? (e.g., blurred vision, chest pain, difficulty breathing, headache, weakness)     Nausea  that prevents eating, lightheadedness/dizziness, denies blurred vision, denies headache, denies weakness, difficulty breathing prior to OV    Patient was seen in office on 03/06/24. Advised ED due to elevated BP and symptoms. Patient declined. Please advise.  Protocols used: Blood Pressure - High-A-AH

## 2024-03-12 ENCOUNTER — Telehealth: Payer: Self-pay | Admitting: Neurology

## 2024-03-12 ENCOUNTER — Ambulatory Visit: Payer: Self-pay | Admitting: Cardiology

## 2024-03-12 DIAGNOSIS — Q211 Atrial septal defect, unspecified: Secondary | ICD-10-CM

## 2024-03-12 NOTE — Telephone Encounter (Signed)
 Re: elevated blood pressure, you switched to twice daily dosage. Please advise.   Copied from CRM [id number]. Topic: Clinical - Medical Advice >> Mar 12, 2024  1:03 PM Thersia C wrote: Reason for CRM: Patient called in would like to leave a message for NP Waddell Mon , Patient stated she came in last wednesday and was told to take one pill in the morning at night and she has been doing it and it still isnt working, would like to know what she needs to do

## 2024-03-13 NOTE — Telephone Encounter (Signed)
 Seeing cardiology would be even better. Thanks!

## 2024-03-13 NOTE — Telephone Encounter (Signed)
 BP was stable when we checked so she will need to bring me a list of home readings so we can tweak things as needed. She also needs to be sure she has a soon follow-up with cardiology - they are doing a workup for her and need to be aware.

## 2024-03-13 NOTE — Telephone Encounter (Signed)
 Spoke with patient. She states readings have been between 150-165/ 90-101. She will bring log of blood pressure readings to her Cardiology appt tomorrow. She was educated on ED precautions.

## 2024-03-13 NOTE — Telephone Encounter (Signed)
 Patient has appt with Cardiology tomorrow, should she follow up with them about blood pressure or make appt and bring readings here?

## 2024-03-13 NOTE — Telephone Encounter (Signed)
-----   Message from [place] R Revankar sent at 03/12/2024  8:47 AM EDT ----- Calcium score is 0 please bring patient in for echo a complete echo with bubble study to evaluate interatrial shunt.  Copy primary Jennifer JONELLE Crape, MD 03/12/2024 8:47 AM  ----- Message ----- From: Interface, Rad Results In Sent: 03/11/2024  10:12 AM EDT To: Jennifer JONELLE Crape, MD

## 2024-03-13 NOTE — Telephone Encounter (Signed)
 MyChart message

## 2024-03-14 ENCOUNTER — Ambulatory Visit: Attending: Cardiology

## 2024-03-14 DIAGNOSIS — I209 Angina pectoris, unspecified: Secondary | ICD-10-CM | POA: Diagnosis present

## 2024-03-14 LAB — ECHOCARDIOGRAM COMPLETE
Area-P 1/2: 3.99 cm2
S' Lateral: 3.8 cm

## 2024-03-15 ENCOUNTER — Telehealth: Payer: Self-pay | Admitting: Cardiology

## 2024-03-15 DIAGNOSIS — I10 Essential (primary) hypertension: Secondary | ICD-10-CM

## 2024-03-15 NOTE — Telephone Encounter (Signed)
 Pt c/o BP issue: STAT if pt c/o blurred vision, one-sided weakness or slurred speech.  STAT if BP is GREATER than 180/120 TODAY.  STAT if BP is LESS than 90/60 and SYMPTOMATIC TODAY  1. What is your BP concern? BP running high  2. Have you taken any BP medication today?yes   3. What are your last 5 BP readings? 9/4 5am 163/99       10am 160/102 9/3 am 166/101       2:30pm  147/85       5pm 154/96  4. Are you having any other symptoms (ex. Dizziness, headache, blurred vision, passed out)? Bad headache the past couple of days, and has been feeling dizzy.   She state he PCP put her on BP medication, but told her to give us  a call and have Dr. Edwyna adjust the medications.

## 2024-03-18 MED ORDER — LOSARTAN POTASSIUM 50 MG PO TABS: 50.0000 mg | ORAL_TABLET | Freq: Two times a day (BID) | ORAL | 3 refills | Status: AC

## 2024-03-18 NOTE — Telephone Encounter (Signed)
 Recommendations reviewed with pt as per Dr. Kem Parkinson note.  Pt verbalized understanding and had no additional questions.

## 2024-04-17 ENCOUNTER — Ambulatory Visit: Admitting: Pulmonary Disease

## 2024-05-03 ENCOUNTER — Other Ambulatory Visit: Payer: Self-pay | Admitting: Family Medicine

## 2024-05-03 ENCOUNTER — Telehealth: Payer: Self-pay | Admitting: Family Medicine

## 2024-05-03 DIAGNOSIS — J449 Chronic obstructive pulmonary disease, unspecified: Secondary | ICD-10-CM

## 2024-05-03 DIAGNOSIS — G629 Polyneuropathy, unspecified: Secondary | ICD-10-CM

## 2024-05-03 MED ORDER — GABAPENTIN 300 MG PO CAPS: 300.0000 mg | ORAL_CAPSULE | Freq: Every day | ORAL | 0 refills | Status: AC

## 2024-05-03 MED ORDER — TRELEGY ELLIPTA 100-62.5-25 MCG/ACT IN AEPB: 1.0000 | INHALATION_SPRAY | Freq: Every day | RESPIRATORY_TRACT | 2 refills | Status: AC

## 2024-05-03 NOTE — Telephone Encounter (Signed)
 Copied from CRM [id number]. Topic: Clinical - Medication Refill >> May 03, 2024 11:50 AM [place] wrote: Medication: Fluticasone-Umeclidin-Vilant (TRELEGY ELLIPTA ) 100-62.5-25 MCG/ACT AEPB ; gabapentin  (NEURONTIN ) 300 MG capsule  Has the patient contacted their pharmacy? Yes (Agent: If no, request that the patient contact the pharmacy for the refill. If patient does not wish to contact the pharmacy document the reason why and proceed with request.) (Agent: If yes, when and what did the pharmacy advise?)  This is the patient's preferred pharmacy:  [facility] [address] [place] - [address] [place] [address] Phone: [phone] Fax: [phone]    Is this the correct pharmacy for this prescription? Yes If no, delete pharmacy and type the correct one.   Has the prescription been filled recently? Yes  Is the patient out of the medication? Yes  Has the patient been seen for an appointment in the last year OR does the patient have an upcoming appointment? Yes  Can we respond through MyChart? Yes  Agent: Please be advised that Rx refills may take up to 3 business days. We ask that you follow-up with your pharmacy.

## 2024-05-13 ENCOUNTER — Encounter: Payer: Self-pay | Admitting: Radiology

## 2024-08-16 ENCOUNTER — Ambulatory Visit: Payer: Self-pay

## 2024-08-16 NOTE — Telephone Encounter (Signed)
 FYI, out of [place] and instructed to go to Urgent Care.

## 2024-08-16 NOTE — Telephone Encounter (Signed)
 FYI Only or Action Required?: Action required by provider: request for appointment.  Patient was last seen in primary care on 03/06/2024 by [name], NP.  Called Nurse Triage reporting Cough.  Symptoms began about a month ago.  Interventions attempted: Nothing.  Symptoms are: unchanged.  Triage Disposition: See HCP Within 4 Hours (Or PCP Triage)  Patient/caregiver understands and will follow disposition?: No, wishes to speak with PCP  Message from Lonell PEDLAR sent at 08/16/2024  9:06 AM EST  Reason for Triage: worsening cough with phlegm, nodules on lung. No improvement with antibiotics.    Reason for Disposition  [1] MILD difficulty breathing (e.g., minimal/no SOB at rest, SOB with walking, pulse < 100) AND [2] still present when not coughing  Answer Assessment - Initial Assessment Questions Offered appt today. Patient declines, patient is currently in [place]  and requests an appt next week with Almarie NP.  Advised UC now and ED/911 if symptoms occur/worsen: severe diff breathing, chest pain > 5 min, faint. Patient verbalized understanding.   Patient's insurance will not cover Trilogy while in [place]; only have Albuterol .   1. ONSET: When did the cough begin?      Month ago; cough, wheezing, sob exertion 2. SEVERITY: How bad is the cough today?      Moderate; last took abt yesterday; 1 dose remaining; doxycyline 100 mg, prednisone complete Albuterol  2 puffs prn x10, Trilogy; does not have  CT scan: 2 nodules, right lung; in ED 3. SPUTUM: Describe the color of your sputum (e.g., none, dry cough; clear, white, yellow, green)     Yellow with green flecks 4. HEMOPTYSIS: Are you coughing up any blood? If Yes, ask: How much? (e.g., flecks, streaks, tablespoons, etc.)     no 5. DIFFICULTY BREATHING: Are you having difficulty breathing? If Yes, ask: How bad is it? (e.g., mild, moderate, severe)      mild 6. FEVER: Do you have a fever? If Yes, ask: What is your  temperature, how was it measured, and when did it start?     Denies fever chills, n/v 7. CARDIAC HISTORY: Do you have any history of heart disease? (e.g., heart attack, congestive heart failure)     Enlarged heart 8. LUNG HISTORY: Do you have any history of lung disease?  (e.g., pulmonary embolus, asthma, emphysema)     copd 9. PE RISK FACTORS: Do you have a history of blood clots? (or: recent major surgery, recent prolonged travel, bedridden)     no 10. OTHER SYMPTOMS: Do you have any other symptoms? (e.g., runny nose, wheezing, chest pain)   Denies diff breath, chest pain, faint, fever chills,n/v  Protocols used: Cough - Acute Productive-A-AH

## 2024-09-02 ENCOUNTER — Ambulatory Visit: Admitting: Pulmonary Disease
